# Patient Record
Sex: Male | Born: 1995 | Race: White | Hispanic: No | Marital: Married | Smoking: Current some day smoker
Health system: Southern US, Community
[De-identification: ages and names within clinical notes are randomized; demographics above are authoritative.]

## PROBLEM LIST (undated history)

## (undated) DIAGNOSIS — B9561 Methicillin susceptible Staphylococcus aureus infection as the cause of diseases classified elsewhere: Secondary | ICD-10-CM

## (undated) DIAGNOSIS — F1990 Other psychoactive substance use, unspecified, uncomplicated: Secondary | ICD-10-CM

## (undated) DIAGNOSIS — R7881 Bacteremia: Secondary | ICD-10-CM

## (undated) DIAGNOSIS — F1910 Other psychoactive substance abuse, uncomplicated: Secondary | ICD-10-CM

## (undated) HISTORY — PX: TONSILLECTOMY: SUR1361

---
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
## 2013-01-26 ENCOUNTER — Emergency Department: Payer: Self-pay | Admitting: Emergency Medicine

## 2014-03-26 ENCOUNTER — Emergency Department: Payer: Self-pay | Admitting: Emergency Medicine

## 2014-04-19 ENCOUNTER — Emergency Department: Payer: Self-pay | Admitting: General Practice

## 2014-08-21 ENCOUNTER — Emergency Department (HOSPITAL_COMMUNITY)
Admission: EM | Admit: 2014-08-21 | Discharge: 2014-08-21 | Disposition: A | Discharge status: Home / Self Care | Payer: Self-pay | Attending: Emergency Medicine | Admitting: Emergency Medicine

## 2014-08-21 ENCOUNTER — Encounter (HOSPITAL_COMMUNITY): Payer: Self-pay | Admitting: Emergency Medicine

## 2014-08-21 DIAGNOSIS — Z720 Tobacco use: Secondary | ICD-10-CM | POA: Insufficient documentation

## 2014-08-21 DIAGNOSIS — T50901A Poisoning by unspecified drugs, medicaments and biological substances, accidental (unintentional), initial encounter: Secondary | ICD-10-CM

## 2014-08-21 DIAGNOSIS — T424X1A Poisoning by benzodiazepines, accidental (unintentional), initial encounter: Secondary | ICD-10-CM | POA: Insufficient documentation

## 2014-08-21 DIAGNOSIS — Y998 Other external cause status: Secondary | ICD-10-CM | POA: Insufficient documentation

## 2014-08-21 DIAGNOSIS — Y9289 Other specified places as the place of occurrence of the external cause: Secondary | ICD-10-CM | POA: Insufficient documentation

## 2014-08-21 DIAGNOSIS — T401X1A Poisoning by heroin, accidental (unintentional), initial encounter: Secondary | ICD-10-CM | POA: Insufficient documentation

## 2014-08-21 DIAGNOSIS — Y9389 Activity, other specified: Secondary | ICD-10-CM | POA: Insufficient documentation

## 2014-08-21 DIAGNOSIS — X58XXXA Exposure to other specified factors, initial encounter: Secondary | ICD-10-CM | POA: Insufficient documentation

## 2014-08-21 LAB — CBC WITH DIFFERENTIAL/PLATELET
BASOS ABS: 0 10*3/uL (ref 0.0–0.1)
Basophils Relative: 0 % (ref 0–1)
Eosinophils Absolute: 0.1 10*3/uL (ref 0.0–0.7)
Eosinophils Relative: 1 % (ref 0–5)
HCT: 43.1 % (ref 39.0–52.0)
HEMOGLOBIN: 14.4 g/dL (ref 13.0–17.0)
LYMPHS PCT: 7 % — AB (ref 12–46)
Lymphs Abs: 1.3 10*3/uL (ref 0.7–4.0)
MCH: 29.3 pg (ref 26.0–34.0)
MCHC: 33.4 g/dL (ref 30.0–36.0)
MCV: 87.6 fL (ref 78.0–100.0)
Monocytes Absolute: 0.8 10*3/uL (ref 0.1–1.0)
Monocytes Relative: 5 % (ref 3–12)
NEUTROS PCT: 87 % — AB (ref 43–77)
Neutro Abs: 15 10*3/uL — ABNORMAL HIGH (ref 1.7–7.7)
PLATELETS: 367 10*3/uL (ref 150–400)
RBC: 4.92 MIL/uL (ref 4.22–5.81)
RDW: 14.6 % (ref 11.5–15.5)
WBC: 17.2 10*3/uL — ABNORMAL HIGH (ref 4.0–10.5)

## 2014-08-21 LAB — BASIC METABOLIC PANEL
ANION GAP: 6 (ref 5–15)
BUN: 8 mg/dL (ref 6–20)
CO2: 29 mmol/L (ref 22–32)
Calcium: 9.2 mg/dL (ref 8.9–10.3)
Chloride: 103 mmol/L (ref 101–111)
Creatinine, Ser: 1.04 mg/dL (ref 0.61–1.24)
GFR calc non Af Amer: >60 mL/min (ref >60)
Glucose, Bld: 139 mg/dL — ABNORMAL HIGH (ref 65–99)
POTASSIUM: 3.9 mmol/L (ref 3.5–5.1)
Sodium: 138 mmol/L (ref 135–145)

## 2014-08-21 MED ORDER — SODIUM CHLORIDE 0.9 % IV SOLN
INTRAVENOUS | Status: DC
  Administered 2014-08-21: 17:00:00 via INTRAVENOUS

## 2014-08-21 NOTE — Discharge Instructions (Signed)
Accidental Overdose  A drug overdose occurs when a chemical substance (drug or medication) is used in amounts large enough to overcome a person. This may result in severe illness or death. This is a type of poisoning. Accidental overdoses of medications or other substances come from a variety of reasons. When this happens accidentally, it is often because the person taking the substance does not know enough about what they have taken. Drugs which commonly cause overdose deaths are alcohol, psychotropic medications (medications which affect the mind), pain medications, illegal drugs (street drugs) such as cocaine and heroin, and multiple drugs taken at the same time. It may result from careless behavior (such as over-indulging at a party). Other causes of overdose may include multiple drug use, a lapse in memory, or drug use after a period of no drug use.   Sometimes overdosing occurs because a person cannot remember if they have taken their medication.   A common unintentional overdose in young children involves multi-vitamins containing iron. Iron is a part of the hemoglobin molecule in blood. It is used to transport oxygen to living cells. When taken in small amounts, iron allows the body to restock hemoglobin. In large amounts, it causes problems in the body. If this overdose is not treated, it can lead to death.  Never take medicines that show signs of tampering or do not seem quite right. Never take medicines in the dark or in poor lighting. Read the label and check each dose of medicine before you take it. When adults are poisoned, it happens most often through carelessness or lack of information. Taking medicines in the dark or taking medicine prescribed for someone else to treat the same type of problem is a dangerous practice.  SYMPTOMS   Symptoms of overdose depend on the medication and amount taken. They can vary from over-activity with stimulant over-dosage, to sleepiness from depressants such as  alcohol, narcotics and tranquilizers. Confusion, dizziness, nausea and vomiting may be present. If problems are severe enough coma and death may result.  DIAGNOSIS   Diagnosis and management are generally straightforward if the drug is known. Otherwise it is more difficult. At times, certain symptoms and signs exhibited by the patient, or blood tests, can reveal the drug in question.   TREATMENT   In an emergency department, most patients can be treated with supportive measures. Antidotes may be available if there has been an overdose of opioids or benzodiazepines. A rapid improvement will often occur if this is the cause of overdose.  At home or away from medical care:   There may be no immediate problems or warning signs in children.   Not everything works well in all cases of poisoning.   Take immediate action. Poisons may act quickly.   If you think someone has swallowed medicine or a household product, and the person is unconscious, having seizures (convulsions), or is not breathing, immediately call for an ambulance.  IF a person is conscious and appears to be doing OK but has swallowed a poison:   Do not wait to see what effect the poison will have. Immediately call a poison control center (listed in the white pages of your telephone book under "Poison Control" or inside the front cover with other emergency numbers). Some poison control centers have TTY capability for the deaf. Check with your local center if you or someone in your family requires this service.   Keep the container so you can read the label on the product for ingredients.     Describe what, when, and how much was taken and the age and condition of the person poisoned. Inform them if the person is vomiting, choking, drowsy, shows a change in color or temperature of skin, is conscious or unconscious, or is convulsing.   Do not cause vomiting unless instructed by medical personnel. Do not induce vomiting or force liquids into a person who  is convulsing, unconscious, or very drowsy.  Stay calm and in control.    Activated charcoal also is sometimes used in certain types of poisoning and you may wish to add a supply to your emergency medicines. It is available without a prescription. Call a poison control center before using this medication.  PREVENTION   Thousands of children die every year from unintentional poisoning. This may be from household chemicals, poisoning from carbon monoxide in a car, taking their parent's medications, or simply taking a few iron pills or vitamins with iron. Poisoning comes from unexpected sources.   Store medicines out of the sight and reach of children, preferably in a locked cabinet. Do not keep medications in a food cabinet. Always store your medicines in a secure place. Get rid of expired medications.   If you have children living with you or have them as occasional guests, you should have child-resistant caps on your medicine containers. Keep everything out of reach. Child proof your home.   If you are called to the telephone or to answer the door while you are taking a medicine, take the container with you or put the medicine out of the reach of small children.   Do not take your medication in front of children. Do not tell your child how good a medication is and how good it is for them. They may get the idea it is more of a treat.   If you are an adult and have accidentally taken an overdose, you need to consider how this happened and what can be done to prevent it from happening again. If this was from a street drug or alcohol, determine if there is a problem that needs addressing. If you are not sure a problems exists, it is easy to talk to a professional and ask them if they think you have a problem. It is better to handle this problem in this way before it happens again and has a much worse consequence.  Document Released: 05/21/2004 Document Revised: 05/30/2011 Document Reviewed: 10/27/2008  [employer]  Patient Information 2015 [employer]. This information is not intended to replace advice given to you by your health care provider. Make sure you discuss any questions you have with your health care provider.

## 2014-08-21 NOTE — ED Notes (Signed)
Per EMS - PT found on sidewalk by anonymous caller who notified Fire Department.  Upon FD arrival, pt was reported to be in respiratory arrest, pt had fresh track marks, was "blue" in color and unresponsive.  PT was bagged, given 2mg  Narcan intranasally, which had no change.  Pt then given another 2mg  Narcan IV via 18g in RAC, and "perked back up."  Sinus tach at 110, BP 128/64, 18 RR. Lungs clear. Pt denies pain. Pt aware of what happened, states he wasn't trying to kill himself.

## 2014-08-21 NOTE — ED Provider Notes (Signed)
CSN: [id number]     Arrival date & time 08/21/14  1547 History   First MD Initiated Contact with Patient 08/21/14 1601     Chief Complaint  Patient presents with  . Drug Overdose   HPI Patient presents to the emergency room after an accidental drug overdose. 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 He also has been using Klonopin's. Patient took the 2 of them together today. He thinks he must have accidentally taken too much. He was found on the side of a sidewalk unresponsive by bystanders. Patient underwent CPR.  He was given 2 mg of Narcan IV. Patient denies any suicidal ideation or intention. He knows he has a drug problem and is planning on going into a rehabilitation facility in [place]. History reviewed. No pertinent past medical history. Past Surgical History  Procedure Laterality Date  . Tonsillectomy     History reviewed. No pertinent family history. History  Substance Use Topics  . Smoking status: Current Every Day Smoker -- 0.50 packs/day for 5 years    Types: Cigarettes  . Smokeless tobacco: Not on file  . Alcohol Use: Yes     Comment: occasionally    Review of Systems  All other systems reviewed and are negative.     Allergies  Sulfa antibiotics  Home Medications   Prior to Admission medications   Not on File   BP 104/62 mmHg  Pulse 66  Temp(Src) 97.3 F (36.3 C) (Oral)  Resp 20  Ht 6\' 1"  (1.854 m)  Wt 160 lb (72.576 kg)  BMI 21.11 kg/m2  SpO2 99% Physical Exam  Constitutional: He appears well-developed and well-nourished. No distress.  HENT:  Head: Normocephalic and atraumatic.  Right Ear: External ear normal.  Left Ear: External ear normal.  Eyes: Conjunctivae are normal. Right eye exhibits no discharge. Left eye exhibits no discharge. No scleral icterus.  Neck: Neck supple. No tracheal deviation present.  Cardiovascular: Normal rate, regular rhythm and intact distal pulses.   Pulmonary/Chest: Effort normal and breath sounds normal. No  stridor. No respiratory distress. He has no wheezes. He has no rales.  Abdominal: Soft. Bowel sounds are normal. He exhibits no distension. There is no tenderness. There is no rebound and no guarding.  Musculoskeletal: He exhibits no edema or tenderness.  Neurological: He is alert. He has normal strength. No cranial nerve deficit (no facial droop, extraocular movements intact, no slurred speech) or sensory deficit. He exhibits normal muscle tone. He displays no seizure activity. Coordination normal.  Skin: Skin is warm and dry. No rash noted.  Psychiatric: He has a normal mood and affect.  Nursing note and vitals reviewed.   ED Course  Procedures (including critical care time) Labs Review Labs Reviewed  CBC WITH DIFFERENTIAL/PLATELET - Abnormal; Notable for the following:    WBC 17.2 (*)    Neutrophils Relative % 87 (*)    Neutro Abs 15.0 (*)    Lymphocytes Relative 7 (*)    All other components within normal limits  BASIC METABOLIC PANEL - Abnormal; Notable for the following:    Glucose, Bld 139 (*)    All other components within normal limits    Imaging Review No results found.   EKG Interpretation   Date/Time:  Thursday August 21 2014 15:47:47 EDT Ventricular Rate:  100 PR Interval:  137 QRS Duration: 102 QT Interval:  358 QTC Calculation: 462 R Axis:   80 Text Interpretation:  Sinus tachycardia Possible WPW , ventricular  pre-excitation syndrome No  old tracing to compare Confirmed by [facility]   MD-J, Sonam Wandel [id number]) on 08/21/2014 4:03:33 PM      MDM   Final diagnoses:  Accidental drug overdose, initial encounter    Patient was monitored in the emergency department. He remained awake and alert.  Patient feels ready to go home. Family member is with him and will be able to watch him at home.  I counseled the patient to stop using heroin and other drugs.  At this time there does not appear to be any evidence of an acute emergency medical condition and the patient appears  stable for discharge with appropriate outpatient follow up.    Linwood Dibbles, MD 08/21/14 [phone]

## 2015-05-10 ENCOUNTER — Encounter: Payer: Self-pay | Admitting: Emergency Medicine

## 2015-05-10 DIAGNOSIS — L02413 Cutaneous abscess of right upper limb: Secondary | ICD-10-CM | POA: Diagnosis not present

## 2015-05-10 DIAGNOSIS — L03113 Cellulitis of right upper limb: Secondary | ICD-10-CM | POA: Insufficient documentation

## 2015-05-10 DIAGNOSIS — F17210 Nicotine dependence, cigarettes, uncomplicated: Secondary | ICD-10-CM | POA: Insufficient documentation

## 2015-05-10 NOTE — ED Notes (Addendum)
Pt with abscess to right AC after using IV drugs; denies fever; denies drainage; no redness surrounding area; tender to touch

## 2015-05-11 ENCOUNTER — Emergency Department
Admission: EM | Admit: 2015-05-11 | Discharge: 2015-05-11 | Disposition: A | Discharge status: Home / Self Care | Payer: Federal, State, Local not specified - PPO | Attending: Emergency Medicine | Admitting: Emergency Medicine

## 2015-05-11 DIAGNOSIS — L0291 Cutaneous abscess, unspecified: Secondary | ICD-10-CM

## 2015-05-11 DIAGNOSIS — L03113 Cellulitis of right upper limb: Secondary | ICD-10-CM

## 2015-05-11 LAB — CBC
HEMATOCRIT: 43 % (ref 40.0–52.0)
Hemoglobin: 14.7 g/dL (ref 13.0–18.0)
MCH: 30.1 pg (ref 26.0–34.0)
MCHC: 34.3 g/dL (ref 32.0–36.0)
MCV: 87.8 fL (ref 80.0–100.0)
PLATELETS: 345 10*3/uL (ref 150–440)
RBC: 4.89 MIL/uL (ref 4.40–5.90)
RDW: 13.7 % (ref 11.5–14.5)
WBC: 10.5 10*3/uL (ref 3.8–10.6)

## 2015-05-11 LAB — BASIC METABOLIC PANEL
Anion gap: 6 (ref 5–15)
BUN: 11 mg/dL (ref 6–20)
CO2: 27 mmol/L (ref 22–32)
Calcium: 9.1 mg/dL (ref 8.9–10.3)
Chloride: 107 mmol/L (ref 101–111)
Creatinine, Ser: 0.75 mg/dL (ref 0.61–1.24)
GFR calc Af Amer: >60 mL/min (ref >60)
GLUCOSE: 137 mg/dL — AB (ref 65–99)
Potassium: 3.5 mmol/L (ref 3.5–5.1)
Sodium: 140 mmol/L (ref 135–145)

## 2015-05-11 MED ORDER — MORPHINE SULFATE (PF) 4 MG/ML IV SOLN
4.0000 mg | Freq: Once | INTRAVENOUS | Status: AC
  Administered 2015-05-11: 4 mg via INTRAVENOUS

## 2015-05-11 MED ORDER — TRAMADOL HCL 50 MG PO TABS
50.0000 mg | ORAL_TABLET | Freq: Once | ORAL | Status: AC
  Administered 2015-05-11: 50 mg via ORAL
  Filled 2015-05-11: qty 1

## 2015-05-11 MED ORDER — TRAMADOL HCL 50 MG PO TABS: 50.0000 mg | ORAL_TABLET | Freq: Four times a day (QID) | ORAL | Status: DC | PRN

## 2015-05-11 MED ORDER — CLINDAMYCIN HCL 300 MG PO CAPS: 300.0000 mg | ORAL_CAPSULE | Freq: Three times a day (TID) | ORAL | Status: DC

## 2015-05-11 MED ORDER — MORPHINE SULFATE (PF) 4 MG/ML IV SOLN
INTRAVENOUS | Status: AC
  Filled 2015-05-11: qty 1

## 2015-05-11 MED ORDER — MORPHINE SULFATE (PF) 4 MG/ML IV SOLN
4.0000 mg | Freq: Once | INTRAVENOUS | Status: AC
  Administered 2015-05-11: 4 mg via INTRAVENOUS
  Filled 2015-05-11: qty 1

## 2015-05-11 MED ORDER — LIDOCAINE HCL (PF) 1 % IJ SOLN
5.0000 mL | Freq: Once | INTRAMUSCULAR | Status: AC
  Filled 2015-05-11: qty 5

## 2015-05-11 MED ORDER — CLINDAMYCIN PHOSPHATE 600 MG/50ML IV SOLN
600.0000 mg | Freq: Once | INTRAVENOUS | Status: AC
  Administered 2015-05-11: 600 mg via INTRAVENOUS
  Filled 2015-05-11: qty 50

## 2015-05-11 MED ORDER — ONDANSETRON HCL 4 MG/2ML IJ SOLN
4.0000 mg | Freq: Once | INTRAMUSCULAR | Status: AC
  Administered 2015-05-11: 4 mg via INTRAVENOUS
  Filled 2015-05-11: qty 2

## 2015-05-11 NOTE — ED Notes (Signed)
Pt requesting additional pain medication. Dr. Zenda Alpers notified.

## 2015-05-11 NOTE — Discharge Instructions (Signed)
Abscess °An abscess is an infected area that contains a collection of pus and debris. It can occur in almost any part of the body. An abscess is also known as a furuncle or boil. °CAUSES  °An abscess occurs when tissue gets infected. This can occur from blockage of oil or sweat glands, infection of hair follicles, or a minor injury to the skin. As the body tries to fight the infection, pus collects in the area and creates pressure under the skin. This pressure causes pain. People with weakened immune systems have difficulty fighting infections and get certain abscesses more often.  °SYMPTOMS °Usually an abscess develops on the skin and becomes a painful mass that is red, warm, and tender. If the abscess forms under the skin, you may feel a moveable soft area under the skin. Some abscesses break open (rupture) on their own, but most will continue to get worse without care. The infection can spread deeper into the body and eventually into the bloodstream, causing you to feel ill.  °DIAGNOSIS  °Your caregiver will take your medical history and perform a physical exam. A sample of fluid may also be taken from the abscess to determine what is causing your infection. °TREATMENT  °Your caregiver may prescribe antibiotic medicines to fight the infection. However, taking antibiotics alone usually does not cure an abscess. Your caregiver may need to make a small cut (incision) in the abscess to drain the pus. In some cases, gauze is packed into the abscess to reduce pain and to continue draining the area. °HOME CARE INSTRUCTIONS  °· Only take over-the-counter or prescription medicines for pain, discomfort, or fever as directed by your caregiver. °· If you were prescribed antibiotics, take them as directed. Finish them even if you start to feel better. °· If gauze is used, follow your caregiver's directions for changing the gauze. °· To avoid spreading the infection: °· Keep your draining abscess covered with a  bandage. °· Wash your hands well. °· Do not share personal care items, towels, or whirlpools with others. °· Avoid skin contact with others. °· Keep your skin and clothes clean around the abscess. °· Keep all follow-up appointments as directed by your caregiver. °SEEK MEDICAL CARE IF:  °· You have increased pain, swelling, redness, fluid drainage, or bleeding. °· You have muscle aches, chills, or a general ill feeling. °· You have a fever. °MAKE SURE YOU:  °· Understand these instructions. °· Will watch your condition. °· Will get help right away if you are not doing well or get worse. °  °This information is not intended to replace advice given to you by your health care provider. Make sure you discuss any questions you have with your health care provider. °  °Document Released: 12/15/2004 Document Revised: 09/06/2011 Document Reviewed: 05/20/2011 °Elsevier Interactive Patient Education ©2016 [employer] ° °Cellulitis °Cellulitis is an infection of the skin and the tissue beneath it. The infected area is usually red and tender. Cellulitis occurs most often in the arms and lower legs.  °CAUSES  °Cellulitis is caused by bacteria that enter the skin through cracks or cuts in the skin. The most common types of bacteria that cause cellulitis are staphylococci and streptococci. °SIGNS AND SYMPTOMS  °· Redness and warmth. °· Swelling. °· Tenderness or pain. °· Fever. °DIAGNOSIS  °Your health care provider can usually determine what is wrong based on a physical exam. Blood tests may also be done. °TREATMENT  °Treatment usually involves taking an antibiotic medicine. °HOME CARE INSTRUCTIONS  °·   Take your antibiotic medicine as directed by your health care provider. Finish the antibiotic even if you start to feel better. °· Keep the infected arm or leg elevated to reduce swelling. °· Apply a warm cloth to the affected area up to 4 times per day to relieve pain. °· Take medicines only as directed by your health care  provider. °· Keep all follow-up visits as directed by your health care provider. °SEEK MEDICAL CARE IF:  °· You notice red streaks coming from the infected area. °· Your red area gets larger or turns dark in color. °· Your bone or joint underneath the infected area becomes painful after the skin has healed. °· Your infection returns in the same area or another area. °· You notice a swollen bump in the infected area. °· You develop new symptoms. °· You have a fever. °SEEK IMMEDIATE MEDICAL CARE IF:  °· You feel very sleepy. °· You develop vomiting or diarrhea. °· You have a general ill feeling (malaise) with muscle aches and pains. °  °This information is not intended to replace advice given to you by your health care provider. Make sure you discuss any questions you have with your health care provider. °  °Document Released: 12/15/2004 Document Revised: 11/26/2014 Document Reviewed: 05/23/2011 °Elsevier Interactive Patient Education ©2016 [employer] ° °Incision and Drainage °Incision and drainage is a procedure in which a sac-like structure (cystic structure) is opened and drained. The area to be drained usually contains material such as pus, fluid, or blood.  °LET YOUR CAREGIVER KNOW ABOUT:  °· Allergies to medicine. °· Medicines taken, including vitamins, herbs, eyedrops, over-the-counter medicines, and creams. °· Use of steroids (by mouth or creams). °· Previous problems with anesthetics or numbing medicines. °· History of bleeding problems or blood clots. °· Previous surgery. °· Other health problems, including diabetes and kidney problems. °· Possibility of pregnancy, if this applies. °RISKS AND COMPLICATIONS °· Pain. °· Bleeding. °· Scarring. °· Infection. °BEFORE THE PROCEDURE  °You may need to have an ultrasound or other imaging tests to see how large or deep your cystic structure is. Blood tests may also be used to determine if you have an infection or how severe the infection is. You may need to have a  tetanus shot. °PROCEDURE  °The affected area is cleaned with a cleaning fluid. The cyst area will then be numbed with a medicine (local anesthetic). A small incision will be made in the cystic structure. A syringe or catheter may be used to drain the contents of the cystic structure, or the contents may be squeezed out. The area will then be flushed with a cleansing solution. After cleansing the area, it is often gently packed with a gauze or another wound dressing. Once it is packed, it will be covered with gauze and tape or some other type of wound dressing.  °AFTER THE PROCEDURE  °· Often, you will be allowed to go home right after the procedure. °· You may be given antibiotic medicine to prevent or heal an infection. °· If the area was packed with gauze or some other wound dressing, you will likely need to come back in 1 to 2 days to get it removed. °· The area should heal in about 14 days. °  °This information is not intended to replace advice given to you by your health care provider. Make sure you discuss any questions you have with your health care provider. °  °Document Released: 08/31/2000 Document Revised: 09/06/2011 Document Reviewed: 05/02/2011 °  Elsevier Interactive Patient Education ©2016 [employer] Inc[employer] ° °

## 2015-05-11 NOTE — ED Provider Notes (Signed)
[facility] Department Provider Note  ____________________________________________  Time seen: Approximately 351 AM  I have reviewed the triage vital signs and the nursing notes.   HISTORY  Chief Complaint Abscess    HPI Elijah Hall is a 20 y.o. male who comes into the hospital with abscess to his right antecubital fossa. The patient reports that he noticed that area with swelling with some redness 2 days ago. He reports is gotten bigger and is more tender since then. He denies any fevers nausea or vomiting. He took some I Profen for pain at home but he reports has not been helping. He has never had this before. 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 He reports that he shared a needle and injected in that same area.The patient reports that this pain is 8 out of 10 in intensity. The area is very tender to touch so he decided to come in and get checked out.   History reviewed. No pertinent past medical history.  There are no active problems to display for this patient.   Past Surgical History  Procedure Laterality Date  . Tonsillectomy      Current Outpatient Rx  Name  Route  Sig  Dispense  Refill  . clindamycin (CLEOCIN) 300 MG capsule   Oral   Take 1 capsule (300 mg total) by mouth 3 (three) times daily.   30 capsule   0   . traMADol (ULTRAM) 50 MG tablet   Oral   Take 1 tablet (50 mg total) by mouth every 6 (six) hours as needed.   12 tablet   0     Allergies Sulfa antibiotics  History reviewed. No pertinent family history.  Social History Social History  Substance Use Topics  . Smoking status: Current Some Day Smoker -- 0.00 packs/day for 5 years    Types: Cigarettes  . Smokeless tobacco: None  . Alcohol Use: Yes     Comment: rarely    Review of Systems Constitutional: No fever/chills Eyes: No visual changes. ENT: No sore throat. Cardiovascular: Denies chest  pain. Respiratory: Denies shortness of breath. Gastrointestinal: No abdominal pain.  No nausea, no vomiting.  No diarrhea.  No constipation. Genitourinary: Negative for dysuria. Musculoskeletal: Negative for back pain. Skin: Abscess to right antecubital fossa Neurological: Negative for headaches, focal weakness or numbness.  10-point ROS otherwise negative.  ____________________________________________   PHYSICAL EXAM:  VITAL SIGNS: ED Triage Vitals  Enc Vitals Group     BP 05/10/15 2343 124/66 mmHg     Pulse Rate 05/10/15 2343 87     Resp 05/10/15 2343 18     Temp 05/10/15 2343 97.6 F (36.4 C)     Temp Source 05/10/15 2343 Oral     SpO2 05/10/15 2343 98 %     Weight 05/10/15 2343 165 lb (74.844 kg)     Height 05/10/15 2343  (1.854 m)     Head Cir --      Peak Flow --      Pain Score 05/10/15 2343 8     Pain Loc --      Pain Edu? --      Excl. in GC? --     Constitutional: Alert and oriented. Well appearing and in moderate distress. Eyes: Conjunctivae are normal. PERRL. EOMI. Head: Atraumatic. Nose: No congestion/rhinnorhea. Mouth/Throat: Mucous membranes are moist.  Oropharynx non-erythematous. Cardiovascular: Normal rate, regular rhythm. Grossly normal heart sounds.  Good peripheral circulation. Respiratory: Normal respiratory effort.  No retractions. Lungs CTAB. Gastrointestinal: Soft and nontender. No distention. Positive bowel sounds Musculoskeletal: No lower extremity tenderness nor edema.   Neurologic:  Normal speech and language.  Skin:  Abscess to right medial antecubital fossa with some erythema to the top tender palpation nonfluctuant Psychiatric: Mood and affect are normal.   ____________________________________________   LABS (all labs ordered are listed, but only abnormal results are displayed)  Labs Reviewed  BASIC METABOLIC PANEL - Abnormal; Notable for the following:    Glucose, Bld 137 (*)    All other components within normal limits   CBC   ____________________________________________  EKG  None ____________________________________________  RADIOLOGY  None ____________________________________________   PROCEDURES  Procedure(s) performed: please, see procedure note(s).   INCISION AND DRAINAGE Performed by: Lucrezia Europe P Consent: Verbal consent obtained. Risks and benefits: risks, benefits and alternatives were discussed Type: abscess  Body area: right antecubital fossa  Anesthesia: local infiltration  Incision was made with a scalpel.  Local anesthetic: lidocaine 1 % without epinephrine  Anesthetic total: 1 ml  Complexity: complex Blunt dissection to break up loculations  Drainage: purulent  Drainage amount: Moderate   Packing material: Unable to pack   Patient tolerance: Patient tolerated the procedure well with no immediate complications.     Critical Care performed: No  ____________________________________________   INITIAL IMPRESSION / ASSESSMENT AND PLAN / ED COURSE  Pertinent labs & imaging results that were available during my care of the patient were reviewed by me and considered in my medical decision making (see chart for details).  This is a 20 year old male who comes into the hospital today with an abscess to his right antecubital fossa. The initial concern was could this be a pseudoaneurysm. I did place an ultrasound on the area and it did show some complex material in the process. I also attempted to aspirate to determine if blood would be aspirated or purulent material and it was purulent material. I then incised the area with 1 1/2-2 cm incision drain some material. I attempted to pack but the area was not deep enough to be packed. The patient did receive some clindamycin as well as some morphine. I will give him some tramadol for pain for home and give him clindamycin. The patient should return in 2 days to have the wound rechecked or if he has any worsening  symptoms vomiting fevers or any other complications ____________________________________________   FINAL CLINICAL IMPRESSION(S) / ED DIAGNOSES  Final diagnoses:  Abscess  Cellulitis of right upper extremity      Rebecka Apley, MD 05/11/15 [phone]

## 2015-05-11 NOTE — ED Notes (Signed)
Dr. Zenda Alpers in to i and d abscess.

## 2015-05-11 NOTE — ED Notes (Signed)
Pt updated on treatment plan

## 2015-05-14 ENCOUNTER — Emergency Department
Admission: EM | Admit: 2015-05-14 | Discharge: 2015-05-14 | Disposition: A | Discharge status: Home / Self Care | Payer: Federal, State, Local not specified - PPO | Attending: Emergency Medicine | Admitting: Emergency Medicine

## 2015-05-14 DIAGNOSIS — Z4801 Encounter for change or removal of surgical wound dressing: Secondary | ICD-10-CM | POA: Diagnosis not present

## 2015-05-14 DIAGNOSIS — Z5189 Encounter for other specified aftercare: Secondary | ICD-10-CM

## 2015-05-14 DIAGNOSIS — F17210 Nicotine dependence, cigarettes, uncomplicated: Secondary | ICD-10-CM | POA: Diagnosis not present

## 2015-05-14 MED ORDER — MUPIROCIN 2 % EX OINT: TOPICAL_OINTMENT | CUTANEOUS | Status: AC

## 2015-05-14 NOTE — ED Provider Notes (Signed)
[facility] Emergency Department Provider Note     Time seen: ----------------------------------------- 10:04 PM on 05/14/2015 -----------------------------------------    I have reviewed the triage vital signs and the nursing notes.   HISTORY  Chief Complaint Wound Check    HPI Dishon Kehoe is a 20 y.o. male who presents ER for wound recheck. Patient had an I&D of an abscess on Monday morning. Patient presents to have the packing removed, he feels like it's healing well his been keeping it clean. He denies any other complaints.   History reviewed. No pertinent past medical history.  There are no active problems to display for this patient.   Past Surgical History  Procedure Laterality Date  . Tonsillectomy      Allergies Sulfa antibiotics  Social History Social History  Substance Use Topics  . Smoking status: Current Some Day Smoker -- 0.00 packs/day for 5 years    Types: Cigarettes  . Smokeless tobacco: None  . Alcohol Use: Yes     Comment: rarely    Review of Systems Constitutional: Negative for fever. Skin: Positive for wound in the right antecubital fossa  ____________________________________________   PHYSICAL EXAM:  VITAL SIGNS: ED Triage Vitals  Enc Vitals Group     BP 05/14/15 2126 115/70 mmHg     Pulse Rate 05/14/15 2126 78     Resp 05/14/15 2126 17     Temp 05/14/15 2126 98.3 F (36.8 C)     Temp Source 05/14/15 2126 Oral     SpO2 05/14/15 2126 99 %     Weight 05/14/15 2126 165 lb (74.844 kg)     Height 05/14/15 2126  (1.854 m)     Head Cir --      Peak Flow --      Pain Score 05/14/15 2127 0     Pain Loc --      Pain Edu? --      Excl. in GC? --     Constitutional: Alert and oriented. Well appearing and in no distress. Musculoskeletal: Small 1.5 cm healing abscess noted to the right before meals. No packing is noted in the wound. No signs of surrounding cellulitis. Skin:  Healing abscess as noted  above ____________________________________________  ED COURSE:  Pertinent labs & imaging results that were available during my care of the patient were reviewed by me and considered in my medical decision making (see chart for details). We'll recheck, wound looks good at this time, does not need further treatment other than topical antibiotic ointment  ____________________________________________  FINAL ASSESSMENT AND PLAN  Wound check  Plan: Patient with abscess status post incision and drainage. 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 Patient states is gone in the morning for drug treatment. I will prescribe Bactroban ointment.   Emily Filbert, MD   Emily Filbert, MD 05/14/15 2206

## 2015-05-14 NOTE — ED Notes (Signed)
Pt. Going home by self will follow up with PCP if needed.

## 2015-05-14 NOTE — ED Notes (Signed)
Patient had I&D of abscess on Monday morning. Here today to have packing removed. Patient states the area has been healing well and he has been keeping the area clean. Area is well healed without signs of infection and no drainage.

## 2015-05-14 NOTE — Discharge Instructions (Signed)
Wound Care °Taking care of your wound properly can help to prevent pain and infection. It can also help your wound to heal more quickly.  °HOW TO CARE FOR YOUR WOUND  °· Take or apply over-the-counter and prescription medicines only as told by your health care provider. °· If you were prescribed antibiotic medicine, take or apply it as told by your health care provider. Do not stop using the antibiotic even if your condition improves. °· Clean the wound each day or as told by your health care provider. °¨ Wash the wound with mild soap and water. °¨ Rinse the wound with water to remove all soap. °¨ Pat the wound dry with a clean towel. Do not rub it. °· There are many different ways to close and cover a wound. For example, a wound can be covered with stitches (sutures), skin glue, or adhesive strips. Follow instructions from your health care provider about: °¨ How to take care of your wound. °¨ When and how you should change your bandage (dressing). °¨ When you should remove your dressing. °¨ Removing whatever was used to close your wound. °· Check your wound every day for signs of infection. Watch for: °¨ Redness, swelling, or pain. °¨ Fluid, blood, or pus. °· Keep the dressing dry until your health care provider says it can be removed. Do not take baths, swim, use a hot tub, or do anything that would put your wound underwater until your health care provider approves. °· Raise (elevate) the injured area above the level of your heart while you are sitting or lying down. °· Do not scratch or pick at the wound. °· Keep all follow-up visits as told by your health care provider. This is important. °SEEK MEDICAL CARE IF: °· You received a tetanus shot and you have swelling, severe pain, redness, or bleeding at the injection site. °· You have a fever. °· Your pain is not controlled with medicine. °· You have increased redness, swelling, or pain at the site of your wound. °· You have fluid, blood, or pus coming from your  wound. °· You notice a bad smell coming from your wound or your dressing. °SEEK IMMEDIATE MEDICAL CARE IF: °· You have a red streak going away from your wound. °  °This information is not intended to replace advice given to you by your health care provider. Make sure you discuss any questions you have with your health care provider. °  °Document Released: 12/15/2007 Document Revised: 07/22/2014 Document Reviewed: 03/03/2014 °Elsevier Interactive Patient Education ©2016 [employer] ° °

## 2016-11-20 ENCOUNTER — Emergency Department (HOSPITAL_COMMUNITY)
Admission: EM | Admit: 2016-11-20 | Discharge: 2016-11-20 | Disposition: A | Discharge status: Home / Self Care | Payer: Federal, State, Local not specified - PPO | Attending: Emergency Medicine | Admitting: Emergency Medicine

## 2016-11-20 ENCOUNTER — Encounter (HOSPITAL_COMMUNITY): Payer: Self-pay

## 2016-11-20 DIAGNOSIS — R4182 Altered mental status, unspecified: Secondary | ICD-10-CM | POA: Diagnosis not present

## 2016-11-20 DIAGNOSIS — F1110 Opioid abuse, uncomplicated: Secondary | ICD-10-CM | POA: Diagnosis not present

## 2016-11-20 DIAGNOSIS — Z5321 Procedure and treatment not carried out due to patient leaving prior to being seen by health care provider: Secondary | ICD-10-CM | POA: Insufficient documentation

## 2016-11-20 DIAGNOSIS — T401X1A Poisoning by heroin, accidental (unintentional), initial encounter: Secondary | ICD-10-CM

## 2016-11-20 NOTE — ED Notes (Signed)
Pt IV removed

## 2016-11-20 NOTE — ED Triage Notes (Signed)
Pt BIB friend after heroin use. Pt unresponsive upon arrival. He appeared pale with blue lips upon arrival. Pt given narcan and is now responsive.

## 2016-11-20 NOTE — ED Notes (Signed)
Pt leaving AMA. He verbalized understanding of the risks and what could happen when the narcan wears off. He states "I'm willing to take the chance." He states that he has narcan available for his use if needed.

## 2017-02-17 ENCOUNTER — Other Ambulatory Visit: Payer: Self-pay

## 2017-02-17 ENCOUNTER — Emergency Department (HOSPITAL_COMMUNITY)
Admission: EM | Admit: 2017-02-17 | Discharge: 2017-02-17 | Disposition: A | Discharge status: Home / Self Care | Payer: Federal, State, Local not specified - PPO | Attending: Emergency Medicine | Admitting: Emergency Medicine

## 2017-02-17 ENCOUNTER — Encounter (HOSPITAL_COMMUNITY): Payer: Self-pay | Admitting: Emergency Medicine

## 2017-02-17 ENCOUNTER — Emergency Department (HOSPITAL_COMMUNITY): Payer: Federal, State, Local not specified - PPO

## 2017-02-17 DIAGNOSIS — W458XXA Other foreign body or object entering through skin, initial encounter: Secondary | ICD-10-CM

## 2017-02-17 DIAGNOSIS — Z0489 Encounter for examination and observation for other specified reasons: Secondary | ICD-10-CM | POA: Diagnosis present

## 2017-02-17 NOTE — ED Notes (Signed)
Pt ambulatory at d/c with all belongings. Pt left department before receiving discharge instructions.

## 2017-02-17 NOTE — ED Notes (Signed)
See EDP assessment 

## 2017-02-17 NOTE — ED Triage Notes (Signed)
Patient reported that the needle of insulin syringe broke while injecting Heroin at his left arm this evening , no bleeding/mild redness at antecubital area .

## 2017-02-17 NOTE — ED Provider Notes (Signed)
[facility] Provider Note   CSN: [id number] Arrival date & time: 02/17/17  0033     History   Chief Complaint Chief Complaint  Patient presents with  . Needle Stuck in Arm    Heroin Addiction     HPI Elijah Hall is a 21 y.o. male.  Patient presents to the emergency department with a chief complaint of foreign body in right arm.  Patient is an IV drug user.  He states that the needle broke in his arm.  He reports this happened several hours ago.  He has not seen anyone for this before.  He denies any symptoms.   The history is provided by the patient. No language interpreter was used.    History reviewed. No pertinent past medical history.  There are no active problems to display for this patient.   Past Surgical History:  Procedure Laterality Date  . TONSILLECTOMY         Home Medications    Prior to Admission medications   Medication Sig Start Date End Date Taking? Authorizing Provider  clindamycin (CLEOCIN) 300 MG capsule Take 1 capsule (300 mg total) by mouth 3 (three) times daily. 05/11/15   Rebecka ApleyWebster, Allison P, MD  traMADol (ULTRAM) 50 MG tablet Take 1 tablet (50 mg total) by mouth every 6 (six) hours as needed. 05/11/15   Rebecka ApleyWebster, Allison P, MD    Family History No family history on file.  Social History Social History   Tobacco Use  . Smoking status: Current Some Day Smoker    Packs/day: 0.00    Years: 5.00    Pack years: 0.00    Types: Cigarettes  . Smokeless tobacco: Never Used  Substance Use Topics  . Alcohol use: Yes    Comment: rarely  . Drug use: Yes    Types: IV    Comment: Heroin Addiction      Allergies   Sulfa antibiotics   Review of Systems Review of Systems  All other systems reviewed and are negative.    Physical Exam Updated Vital Signs BP 135/77 (BP Location: Left Arm)   Pulse 68   Temp 98.2 F (36.8 C) (Oral)   Resp 18   SpO2 100%   Physical Exam  Constitutional: He is  oriented to person, place, and time. He appears well-developed and well-nourished.  HENT:  Head: Normocephalic and atraumatic.  Eyes: Conjunctivae and EOM are normal.  Neck: Normal range of motion.  Cardiovascular: Normal rate.  Pulmonary/Chest: Effort normal.  Abdominal: He exhibits no distension.  Musculoskeletal: Normal range of motion.  Neurological: He is alert and oriented to person, place, and time.  Skin: Skin is dry.  Psychiatric: He has a normal mood and affect. His behavior is normal. Judgment and thought content normal.  Nursing note and vitals reviewed.    ED Treatments / Results  Labs (all labs ordered are listed, but only abnormal results are displayed) Labs Reviewed - No data to display  EKG  EKG Interpretation None       Radiology Dg Elbow Complete Right  Result Date: 02/17/2017 CLINICAL DATA:  Needle in arm for 5 hours. EXAM: RIGHT ELBOW - COMPLETE 3+ VIEW COMPARISON:  RIGHT elbow radiographs February 11, 2011 FINDINGS: No acute fracture deformity or dislocation. No destructive bony lesions. Soft tissue planes are not suspicious. No radiopaque foreign bodies. IMPRESSION: Negative. Electronically Signed   By: Awilda Metroourtnay  Bloomer M.D.   On: 02/17/2017 03:27    Procedures Procedures (including  critical care time)  Medications Ordered in ED Medications - No data to display   Initial Impression / Assessment and Plan / ED Course  I have reviewed the triage vital signs and the nursing notes.  Pertinent labs & imaging results that were available during my care of the patient were reviewed by me and considered in my medical decision making (see chart for details).    Patient is an IV drug user, who thought that his needle broke off in his arm tonight.  X-ray shows no evidence of needle. Dc to home with PCP follow-up.  Final Clinical Impressions(s) / ED Diagnoses   Final diagnoses:  Foreign body entering through skin, initial encounter    ED Discharge  Orders    None       Roxy HorsemanBrowning, Grayton Lobo, PA-C 02/17/17 [id number]    Geoffery Lyonselo, Douglas, MD 02/17/17 [id number]

## 2017-02-17 NOTE — Discharge Instructions (Signed)
Don't use IV drugs.  They are harmful and can kill you.

## 2017-12-20 ENCOUNTER — Emergency Department (HOSPITAL_COMMUNITY)
Admission: EM | Admit: 2017-12-20 | Discharge: 2017-12-20 | Disposition: A | Discharge status: Home / Self Care | Payer: Federal, State, Local not specified - PPO | Attending: Emergency Medicine | Admitting: Emergency Medicine

## 2017-12-20 ENCOUNTER — Encounter (HOSPITAL_COMMUNITY): Payer: Self-pay | Admitting: Emergency Medicine

## 2017-12-20 ENCOUNTER — Other Ambulatory Visit: Payer: Self-pay

## 2017-12-20 ENCOUNTER — Emergency Department (HOSPITAL_COMMUNITY): Payer: Federal, State, Local not specified - PPO

## 2017-12-20 DIAGNOSIS — L0291 Cutaneous abscess, unspecified: Secondary | ICD-10-CM

## 2017-12-20 DIAGNOSIS — L02414 Cutaneous abscess of left upper limb: Secondary | ICD-10-CM | POA: Diagnosis not present

## 2017-12-20 MED ORDER — DOXYCYCLINE HYCLATE 100 MG PO CAPS: 100.0000 mg | ORAL_CAPSULE | Freq: Two times a day (BID) | ORAL | 0 refills | Status: DC

## 2017-12-20 MED ORDER — DOXYCYCLINE HYCLATE 100 MG PO TABS
100.0000 mg | ORAL_TABLET | Freq: Once | ORAL | Status: AC
  Administered 2017-12-20: 100 mg via ORAL
  Filled 2017-12-20: qty 1

## 2017-12-20 MED ORDER — LIDOCAINE-EPINEPHRINE 1 %-1:100000 IJ SOLN
20.0000 mL | Freq: Once | INTRAMUSCULAR | Status: AC
  Administered 2017-12-20: 20 mL
  Filled 2017-12-20 (×2): qty 20

## 2017-12-20 NOTE — Discharge Instructions (Addendum)
Please read attached information. If you experience any new or worsening signs or symptoms please return to the emergency room for evaluation. Please follow-up with your primary care provider or specialist as discussed. Please use medication prescribed only as directed and discontinue taking if you have any concerning signs or symptoms.   °

## 2017-12-20 NOTE — ED Triage Notes (Addendum)
Pt with left antecubital abscess that began 1 week ago after using IV heroin. The area is swollen without redness or drainage.

## 2017-12-20 NOTE — ED Provider Notes (Signed)
[facility] EMERGENCY DEPARTMENT Provider Note   CSN: [id number] Arrival date & time: 12/20/17  1052     History   Chief Complaint Chief Complaint  Patient presents with  . Abscess    HPI Elijah Hall is a 22 y.o. male.  HPI   22 year old male presents today with abscess to left antecubital space.  Patient notes symptoms started approximate 1 week ago after using IV drugs.  Patient notes that it increased over the last several days, with severe pain and difficulty with range of motion of the elbow.  Patient denies any overlying redness, fever chills nausea or vomiting.  Patient does note history of the same.  History reviewed. No pertinent past medical history.  There are no active problems to display for this patient.   Past Surgical History:  Procedure Laterality Date  . TONSILLECTOMY          Home Medications    Prior to Admission medications   Medication Sig Start Date End Date Taking? Authorizing Provider  clindamycin (CLEOCIN) 300 MG capsule Take 1 capsule (300 mg total) by mouth 3 (three) times daily. 05/11/15   Rebecka Apley, MD  doxycycline (VIBRAMYCIN) 100 MG capsule Take 1 capsule (100 mg total) by mouth 2 (two) times daily. 12/20/17   Placido Hangartner, Tinnie Gens, PA-C  traMADol (ULTRAM) 50 MG tablet Take 1 tablet (50 mg total) by mouth every 6 (six) hours as needed. 05/11/15   Rebecka Apley, MD    Family History No family history on file.  Social History Social History   Tobacco Use  . Smoking status: Current Some Day Smoker    Packs/day: 0.50    Years: 5.00    Pack years: 2.50    Types: Cigarettes  . Smokeless tobacco: Never Used  Substance Use Topics  . Alcohol use: Yes    Comment: rarely  . Drug use: Yes    Types: IV    Comment: Heroin Addiction      Allergies   Sulfa antibiotics   Review of Systems Review of Systems  All other systems reviewed and are negative.    Physical Exam Updated Vital Signs BP 109/65 (BP  Location: Right Arm)   Pulse 69   Temp 98 F (36.7 C) (Oral)   Resp 15   Ht 6' (1.829 m)   Wt 72.6 kg   SpO2 98%   BMI 21.70 kg/m   Physical Exam  Constitutional: He is oriented to person, place, and time. He appears well-developed and well-nourished.  HENT:  Head: Normocephalic and atraumatic.  Eyes: Pupils are equal, round, and reactive to light. Conjunctivae are normal. Right eye exhibits no discharge. Left eye exhibits no discharge. No scleral icterus.  Neck: Normal range of motion. No JVD present. No tracheal deviation present.  Pulmonary/Chest: Effort normal. No stridor.  Musculoskeletal:  Left antecubital fossa with swelling approximately 2 cm, firm no fluctuance, no overlying redness or discharge-flexion of the elbow intact difficulty with extension secondary to discomfort  Neurological: He is alert and oriented to person, place, and time. Coordination normal.  Psychiatric: He has a normal mood and affect. His behavior is normal. Judgment and thought content normal.  Nursing note and vitals reviewed.    ED Treatments / Results  Labs (all labs ordered are listed, but only abnormal results are displayed) Labs Reviewed  AEROBIC/ANAEROBIC CULTURE (SURGICAL/DEEP WOUND)    EKG None  Radiology Dg Elbow Complete Left  Result Date: 12/20/2017 CLINICAL DATA:  Swelling of the  anterior elbow, IV drug user, possible infection EXAM: LEFT ELBOW - COMPLETE 3+ VIEW COMPARISON:  None. FINDINGS: Alignment is normal. The elbow joint spaces appear normal. No fracture seen and no joint effusion is noted. IMPRESSION: Negative. Electronically Signed   By: Dwyane Dee M.D.   On: 12/20/2017 14:49    Procedures .Marland KitchenIncision and Drainage Date/Time: 12/20/2017 3:36 PM Performed by: Eyvonne Mechanic, PA-C Authorized by: Eyvonne Mechanic, PA-C   Consent:    Consent obtained:  Verbal   Consent given by:  Patient   Risks discussed:  Bleeding, incomplete drainage, infection, damage to other  organs and pain   Alternatives discussed:  No treatment, delayed treatment and alternative treatment Location:    Type:  Abscess   Size:  2   Location: Left arm. Pre-procedure details:    Skin preparation:  Betadine and Chloraprep Anesthesia (see MAR for exact dosages):    Anesthesia method:  Local infiltration   Local anesthetic:  Lidocaine 2% WITH epi Procedure type:    Complexity:  Simple Procedure details:    Needle aspiration: yes     Needle size:  18 G   Incision types:  Single straight   Scalpel blade:  11   Wound management:  Probed and deloculated   Drainage:  Purulent and bloody   Drainage amount:  Moderate   Wound treatment:  Wound left open   Packing materials:  None Post-procedure details:    Patient tolerance of procedure:  Tolerated well, no immediate complications   (including critical care time)  Medications Ordered in ED Medications  doxycycline (VIBRA-TABS) tablet 100 mg (has no administration in time range)  lidocaine-EPINEPHrine (XYLOCAINE W/EPI) 1 %-1:100000 (with pres) injection 20 mL (20 mLs Infiltration Given by Other 12/20/17 1359)     Initial Impression / Assessment and Plan / ED Course  I have reviewed the triage vital signs and the nursing notes.  Pertinent labs & imaging results that were available during my care of the patient were reviewed by me and considered in my medical decision making (see chart for details).     Labs: Anaerobic aerobic culture  Imaging: DG elbow  Consults:  Therapeutics: Doxycycline and lidocaine with epinephrine  Discharge Meds: Doxycycline  Assessment/Plan:   22 year old male presents today with an abscess to his left arm.  I&D successful, culture sent.  No signs of systemic illness range of motion increased in the arm, no signs of deep space infection.  Discharged with antibiotics, strict return precautions and follow-up information.  Patient verbalized understanding and agreement to today's plan had no  further questions or concerns the time discharge.  Final Clinical Impressions(s) / ED Diagnoses   Final diagnoses:  Abscess    ED Discharge Orders         Ordered    doxycycline (VIBRAMYCIN) 100 MG capsule  2 times daily     12/20/17 1538           Eudora Guevarra, [place], PA-C 12/20/17 1540    Mancel Bale, MD 12/22/17 1409

## 2017-12-24 LAB — AEROBIC/ANAEROBIC CULTURE W GRAM STAIN (SURGICAL/DEEP WOUND): Special Requests: NORMAL

## 2017-12-24 LAB — AEROBIC/ANAEROBIC CULTURE (SURGICAL/DEEP WOUND)

## 2017-12-25 ENCOUNTER — Encounter (HOSPITAL_COMMUNITY): Payer: Self-pay

## 2017-12-25 ENCOUNTER — Telehealth: Payer: Self-pay | Admitting: Emergency Medicine

## 2017-12-25 ENCOUNTER — Other Ambulatory Visit: Payer: Self-pay

## 2017-12-25 ENCOUNTER — Emergency Department (HOSPITAL_COMMUNITY)
Admission: EM | Admit: 2017-12-25 | Discharge: 2017-12-25 | Disposition: A | Discharge status: Home / Self Care | Payer: Federal, State, Local not specified - PPO | Attending: Emergency Medicine | Admitting: Emergency Medicine

## 2017-12-25 DIAGNOSIS — I808 Phlebitis and thrombophlebitis of other sites: Secondary | ICD-10-CM | POA: Diagnosis not present

## 2017-12-25 DIAGNOSIS — L0291 Cutaneous abscess, unspecified: Secondary | ICD-10-CM

## 2017-12-25 DIAGNOSIS — R2232 Localized swelling, mass and lump, left upper limb: Secondary | ICD-10-CM | POA: Insufficient documentation

## 2017-12-25 DIAGNOSIS — I809 Phlebitis and thrombophlebitis of unspecified site: Secondary | ICD-10-CM

## 2017-12-25 DIAGNOSIS — F17210 Nicotine dependence, cigarettes, uncomplicated: Secondary | ICD-10-CM | POA: Insufficient documentation

## 2017-12-25 HISTORY — DX: Other psychoactive substance abuse, uncomplicated: F19.10

## 2017-12-25 LAB — CBC WITH DIFFERENTIAL/PLATELET
Abs Immature Granulocytes: 0 10*3/uL (ref 0.0–0.1)
Basophils Absolute: 0.1 10*3/uL (ref 0.0–0.1)
Basophils Relative: 1 %
EOS PCT: 2 %
Eosinophils Absolute: 0.2 10*3/uL (ref 0.0–0.7)
HEMATOCRIT: 45 % (ref 39.0–52.0)
HEMOGLOBIN: 14.8 g/dL (ref 13.0–17.0)
Immature Granulocytes: 0 %
LYMPHS ABS: 2 10*3/uL (ref 0.7–4.0)
Lymphocytes Relative: 26 %
MCH: 28 pg (ref 26.0–34.0)
MCHC: 32.9 g/dL (ref 30.0–36.0)
MCV: 85.2 fL (ref 78.0–100.0)
Monocytes Absolute: 0.7 10*3/uL (ref 0.1–1.0)
Monocytes Relative: 9 %
NEUTROS ABS: 4.9 10*3/uL (ref 1.7–7.7)
Neutrophils Relative %: 62 %
Platelets: 330 10*3/uL (ref 150–400)
RBC: 5.28 MIL/uL (ref 4.22–5.81)
RDW: 14.1 % (ref 11.5–15.5)
WBC: 7.8 10*3/uL (ref 4.0–10.5)

## 2017-12-25 LAB — COMPREHENSIVE METABOLIC PANEL
ALT: 11 U/L (ref 0–44)
AST: 17 U/L (ref 15–41)
Albumin: 4.1 g/dL (ref 3.5–5.0)
Alkaline Phosphatase: 58 U/L (ref 38–126)
Anion gap: 8 (ref 5–15)
BILIRUBIN TOTAL: 0.5 mg/dL (ref 0.3–1.2)
BUN: 10 mg/dL (ref 6–20)
CHLORIDE: 104 mmol/L (ref 98–111)
CO2: 24 mmol/L (ref 22–32)
Calcium: 9.6 mg/dL (ref 8.9–10.3)
Creatinine, Ser: 0.92 mg/dL (ref 0.61–1.24)
GFR calc Af Amer: >60 mL/min (ref >60)
GFR calc non Af Amer: >60 mL/min (ref >60)
Glucose, Bld: 117 mg/dL — ABNORMAL HIGH (ref 70–99)
Potassium: 4.1 mmol/L (ref 3.5–5.1)
Sodium: 136 mmol/L (ref 135–145)
TOTAL PROTEIN: 8 g/dL (ref 6.5–8.1)

## 2017-12-25 LAB — I-STAT CG4 LACTIC ACID, ED: Lactic Acid, Venous: 1.58 mmol/L (ref 0.5–1.9)

## 2017-12-25 MED ORDER — CEPHALEXIN 250 MG PO CAPS
1000.0000 mg | ORAL_CAPSULE | Freq: Once | ORAL | Status: AC
  Administered 2017-12-25: 1000 mg via ORAL
  Filled 2017-12-25: qty 4

## 2017-12-25 MED ORDER — CEPHALEXIN 500 MG PO CAPS: 500.0000 mg | ORAL_CAPSULE | Freq: Four times a day (QID) | ORAL | 0 refills | Status: DC

## 2017-12-25 MED ORDER — ACETAMINOPHEN 500 MG PO TABS
1000.0000 mg | ORAL_TABLET | Freq: Once | ORAL | Status: AC
  Administered 2017-12-25: 1000 mg via ORAL
  Filled 2017-12-25: qty 2

## 2017-12-25 MED ORDER — IBUPROFEN 800 MG PO TABS
800.0000 mg | ORAL_TABLET | Freq: Once | ORAL | Status: AC
  Administered 2017-12-25: 800 mg via ORAL
  Filled 2017-12-25: qty 1

## 2017-12-25 NOTE — Discharge Instructions (Addendum)
There is help if you need it.  Please do not use dirty needles, this could cause you a severe infection to your skin, heart or spinal cord.  This could kill you or leave you permanently disabled.   ° °[place] [facility]ioid Problem ([facility]) °Fixed; mobile; peer-based °Chase Holleman °[phone] °[email or web address] °Fixed site exchange at [employer], Wednesdays (2-5pm) and Thursdays (3-8pm). °[address] °[place], [place] [address] °Call or text to arrange mobile and peer exchange, Mondays (1-4pm) and Fridays (4-7pm). °Serving [place] ° °

## 2017-12-25 NOTE — Progress Notes (Signed)
ED Antimicrobial Stewardship Positive Culture Follow Up   Elijah Hall is an 22 y.o. male who presented to [facility] on 12/20/2017 with a chief complaint of  Chief Complaint  Patient presents with  . Abscess    Recent Results (from the past 720 hour(s))  Aerobic/Anaerobic Culture (surgical/deep wound)     Status: None   Collection Time: 12/20/17  3:03 PM  Result Value Ref Range Status   Specimen Description ABSCESS ARM  Final   Special Requests Normal  Final   Gram Stain   Final    MODERATE WBC PRESENT,BOTH PMN AND MONONUCLEAR ABUNDANT GRAM POSITIVE COCCI FEW GRAM NEGATIVE RODS RARE GRAM POSITIVE RODS    Culture   Final    FEW ENTEROBACTER AEROGENES FEW STAPHYLOCOCCUS AUREUS FEW PREVOTELLA SPECIES BETA LACTAMASE POSITIVE Performed at [facility], [address], [place], [place] [address]    Report Status 12/24/2017 FINAL  Final   Organism ID, Bacteria ENTEROBACTER AEROGENES  Final   Organism ID, Bacteria STAPHYLOCOCCUS AUREUS  Final      Susceptibility   Enterobacter aerogenes - MIC*    CEFAZOLIN RESISTANT Resistant     CEFEPIME <=1 SENSITIVE Sensitive     CEFTAZIDIME <=1 SENSITIVE Sensitive     CEFTRIAXONE <=1 SENSITIVE Sensitive     CIPROFLOXACIN <=0.25 SENSITIVE Sensitive     GENTAMICIN <=1 SENSITIVE Sensitive     IMIPENEM 1 SENSITIVE Sensitive     TRIMETH/SULFA <=20 SENSITIVE Sensitive     PIP/TAZO <=4 SENSITIVE Sensitive     * FEW ENTEROBACTER AEROGENES   Staphylococcus aureus - MIC*    CIPROFLOXACIN 2 INTERMEDIATE Intermediate     ERYTHROMYCIN RESISTANT Resistant     GENTAMICIN <=0.5 SENSITIVE Sensitive     OXACILLIN 0.5 SENSITIVE Sensitive     TETRACYCLINE <=1 SENSITIVE Sensitive     VANCOMYCIN <=0.5 SENSITIVE Sensitive     TRIMETH/SULFA <=10 SENSITIVE Sensitive     CLINDAMYCIN RESISTANT Resistant     RIFAMPIN <=0.5 SENSITIVE Sensitive     Inducible Clindamycin POSITIVE Resistant     * FEW STAPHYLOCOCCUS AUREUS    Patient treated with doxycycline  100 mg BID for antecubital abscess post I&D. Per cultures, doxycyline is treating MSSA and Prevotella species, but no coverage for Enterobacter aerogenes.   Plan: Call patient for symptom check (pain, redness, purulence). If symptoms have not improved, start cefdinir 300 mg po BID x 5 days.   ED Provider: Meridee Score, MD   Lyanne Co 12/25/2017, 9:02 AM PharmD Candidate Monday - Friday phone -  [phone] Saturday - Sunday phone - [phone]

## 2017-12-25 NOTE — Telephone Encounter (Signed)
Post ED Visit - Positive Culture Follow-up: Successful Patient Follow-Up  Culture assessed and recommendations reviewed by:  []  Enzo Bi, Pharm.D. []  Celedonio Miyamoto, Pharm.D., BCPS AQ-ID []  Garvin Fila, Pharm.D., BCPS []  Georgina Pillion, Pharm.D., BCPS []  [place], [address]D., BCPS, AAHIVP []  Estella Husk, Pharm.D., BCPS, AAHIVP []  Lysle Pearl, PharmD, BCPS []  Phillips Climes, PharmD, BCPS []  Agapito Games, PharmD, BCPS []  Verlan Friends, PharmD Lyanne Co PharmD  Positive wound culture  []  Patient discharged without antimicrobial prescription and treatment is now indicated []  Organism is resistant to prescribed ED discharge antimicrobial []  Patient with positive blood cultures  Changes discussed with ED provider: Meridee Score MD New antibiotic prescription symptom check, if worse start Cefdinir 300mg  po bid x 5 days  Attempting to contact patient    Berle Mull 12/25/2017, 1:52 PM

## 2017-12-25 NOTE — ED Provider Notes (Signed)
[facility] EMERGENCY DEPARTMENT Provider Note   CSN: [id number] Arrival date & time: 12/25/17  1144     History   Chief Complaint Chief Complaint  Patient presents with  . Abscess    HPI Elijah Hall is a 22 y.o. male.  22 yo M with a chief complaint of an abscess.  This was in the left [facility] after using heroin.  He had this lanced over a week ago.  Was started on doxycycline.  Since then he feels that the redness is gone down but he has swelling more on the upper arm.  He denies fevers or chills.  Has become more swollen and painful.  Worsening over the past couple days.  The history is provided by the patient.  Abscess  Associated symptoms: no fever, no headaches and no vomiting   Illness  This is a new problem. The current episode started more than 1 week ago. The problem occurs constantly. The problem has been gradually worsening. Pertinent negatives include no chest pain, no abdominal pain, no headaches and no shortness of breath. Nothing aggravates the symptoms. Nothing relieves the symptoms. He has tried nothing for the symptoms. The treatment provided no relief.    Past Medical History:  Diagnosis Date  . IV drug abuse (HCC)     There are no active problems to display for this patient.   Past Surgical History:  Procedure Laterality Date  . TONSILLECTOMY          Home Medications    Prior to Admission medications   Medication Sig Start Date End Date Taking? Authorizing Provider  cephALEXin (KEFLEX) 500 MG capsule Take 1 capsule (500 mg total) by mouth 4 (four) times daily. 12/25/17   Melene Plan, DO  clindamycin (CLEOCIN) 300 MG capsule Take 1 capsule (300 mg total) by mouth 3 (three) times daily. 05/11/15   Rebecka Apley, MD  doxycycline (VIBRAMYCIN) 100 MG capsule Take 1 capsule (100 mg total) by mouth 2 (two) times daily. 12/20/17   Hedges, Tinnie Gens, PA-C  traMADol (ULTRAM) 50 MG tablet Take 1 tablet (50 mg total) by mouth every 6 (six) hours  as needed. 05/11/15   Rebecka Apley, MD    Family History History reviewed. No pertinent family history.  Social History Social History   Tobacco Use  . Smoking status: Current Some Day Smoker    Packs/day: 0.50    Years: 5.00    Pack years: 2.50    Types: Cigarettes  . Smokeless tobacco: Never Used  Substance Use Topics  . Alcohol use: Yes    Comment: rarely  . Drug use: Yes    Types: IV    Comment: Heroin Addiction      Allergies   Sulfa antibiotics   Review of Systems Review of Systems  Constitutional: Negative for chills and fever.  HENT: Negative for congestion and facial swelling.   Eyes: Negative for discharge and visual disturbance.  Respiratory: Negative for shortness of breath.   Cardiovascular: Negative for chest pain and palpitations.  Gastrointestinal: Negative for abdominal pain, diarrhea and vomiting.  Musculoskeletal: Positive for arthralgias and myalgias.  Skin: Negative for color change and rash.  Neurological: Negative for tremors, syncope and headaches.  Psychiatric/Behavioral: Negative for confusion and dysphoric mood.     Physical Exam Updated Vital Signs BP 116/61 (BP Location: Right Arm)   Pulse 83   Temp 98.8 F (37.1 C) (Oral)   Resp 18   Ht 6' (1.829 m)  Wt 70.3 kg   SpO2 100%   BMI 21.02 kg/m   Physical Exam  Constitutional: He is oriented to person, place, and time. He appears well-developed and well-nourished.  HENT:  Head: Normocephalic and atraumatic.  Eyes: Pupils are equal, round, and reactive to light. EOM are normal.  Neck: Normal range of motion. Neck supple. No JVD present.  Cardiovascular: Normal rate and regular rhythm. Exam reveals no gallop and no friction rub.  No murmur heard. Pulmonary/Chest: No respiratory distress. He has no wheezes.  Abdominal: He exhibits no distension. There is no rebound and no guarding.  Musculoskeletal: Normal range of motion. He exhibits edema and tenderness.  Small amount  of erythema and ulceration at the left [facility].  Patient has some pain along where the brachial vein extends up into the mid arm.  There is some edema there.  No erythema.  Pulse motor and sensation is intact distally.  Neurological: He is alert and oriented to person, place, and time.  Skin: No rash noted. No pallor.  Psychiatric: He has a normal mood and affect. His behavior is normal.  Nursing note and vitals reviewed.    ED Treatments / Results  Labs (all labs ordered are listed, but only abnormal results are displayed) Labs Reviewed  COMPREHENSIVE METABOLIC PANEL - Abnormal; Notable for the following components:      Result Value   Glucose, Bld 117 (*)    All other components within normal limits  CBC WITH DIFFERENTIAL/PLATELET  I-STAT CG4 LACTIC ACID, ED    EKG None  Radiology No results found.  Procedures Procedures (including critical care time)  Medications Ordered in ED Medications  cephALEXin (KEFLEX) capsule 1,000 mg (1,000 mg Oral Given 12/25/17 1304)  acetaminophen (TYLENOL) tablet 1,000 mg (1,000 mg Oral Given 12/25/17 1304)  ibuprofen (ADVIL,MOTRIN) tablet 800 mg (800 mg Oral Given 12/25/17 1304)     Initial Impression / Assessment and Plan / ED Course  I have reviewed the triage vital signs and the nursing notes.  Pertinent labs & imaging results that were available during my care of the patient were reviewed by me and considered in my medical decision making (see chart for details).     22 yo M with a chief complaint of left arm pain and swelling.  Going on for the past week.  Had an I&D performed and was doing better until couple days ago when it became more swollen.  He has some pain and swelling to the left upper arm but there is no erythema he is not febrile.  I doubt that he has a deep space abscess in that area.  He had a cultures and sensitivities that was done on his I&D that showed that parts of the bacteria was not treated by doxycycline.  I will start  him on Keflex to go on top of the doxycycline.  We will have him follow-up with his PCP.  Continue doing Tylenol and NSAIDs and warm compresses.  No leukocytosis, mildly elevated glucose level and CMP.  Lactate is normal.  Discharge home.  1:16 PM:  I have discussed the diagnosis/risks/treatment options with the patient and family and believe the pt to be eligible for discharge home to follow-up with PCP. We also discussed returning to the ED immediately if new or worsening sx occur. We discussed the sx which are most concerning (e.g., sudden worsening pain, fever, inability to tolerate by mouth) that necessitate immediate return. Medications administered to the patient during their visit and any  new prescriptions provided to the patient are listed below.  Medications given during this visit Medications  cephALEXin (KEFLEX) capsule 1,000 mg (1,000 mg Oral Given 12/25/17 1304)  acetaminophen (TYLENOL) tablet 1,000 mg (1,000 mg Oral Given 12/25/17 1304)  ibuprofen (ADVIL,MOTRIN) tablet 800 mg (800 mg Oral Given 12/25/17 1304)      The patient appears reasonably screen and/or stabilized for discharge and I doubt any other medical condition or other [facility] requiring further screening, evaluation, or treatment in the ED at this time prior to discharge.    Final Clinical Impressions(Hall) / ED Diagnoses   Final diagnoses:  Phlebitis  Abscess    ED Discharge Orders         Ordered    cephALEXin (KEFLEX) 500 MG capsule  4 times daily     12/25/17 1309           Melene Plan, DO 12/25/17 1316

## 2017-12-25 NOTE — ED Triage Notes (Signed)
Pt had left ac abscess due to IV heroin use. Was here recently  And had it lanced and drained, pt now has pain further up the arm with swelling. VSS.

## 2017-12-30 ENCOUNTER — Inpatient Hospital Stay (HOSPITAL_COMMUNITY): Payer: Federal, State, Local not specified - PPO

## 2017-12-30 ENCOUNTER — Inpatient Hospital Stay (HOSPITAL_COMMUNITY)
Admission: EM | Admit: 2017-12-30 | Discharge: 2018-01-02 | DRG: 603 | Disposition: A | Discharge status: Home / Self Care | Payer: Federal, State, Local not specified - PPO | Attending: Family Medicine | Admitting: Family Medicine

## 2017-12-30 ENCOUNTER — Other Ambulatory Visit: Payer: Self-pay

## 2017-12-30 ENCOUNTER — Emergency Department (HOSPITAL_COMMUNITY): Payer: Federal, State, Local not specified - PPO

## 2017-12-30 ENCOUNTER — Encounter (HOSPITAL_COMMUNITY): Payer: Self-pay | Admitting: Emergency Medicine

## 2017-12-30 DIAGNOSIS — L03119 Cellulitis of unspecified part of limb: Secondary | ICD-10-CM | POA: Diagnosis not present

## 2017-12-30 DIAGNOSIS — L02414 Cutaneous abscess of left upper limb: Principal | ICD-10-CM | POA: Diagnosis present

## 2017-12-30 DIAGNOSIS — F1990 Other psychoactive substance use, unspecified, uncomplicated: Secondary | ICD-10-CM | POA: Diagnosis present

## 2017-12-30 DIAGNOSIS — E876 Hypokalemia: Secondary | ICD-10-CM | POA: Diagnosis present

## 2017-12-30 DIAGNOSIS — L02419 Cutaneous abscess of limb, unspecified: Secondary | ICD-10-CM | POA: Diagnosis present

## 2017-12-30 DIAGNOSIS — M25522 Pain in left elbow: Secondary | ICD-10-CM | POA: Diagnosis present

## 2017-12-30 DIAGNOSIS — Z8614 Personal history of Methicillin resistant Staphylococcus aureus infection: Secondary | ICD-10-CM

## 2017-12-30 DIAGNOSIS — L0291 Cutaneous abscess, unspecified: Secondary | ICD-10-CM

## 2017-12-30 DIAGNOSIS — F17210 Nicotine dependence, cigarettes, uncomplicated: Secondary | ICD-10-CM | POA: Diagnosis present

## 2017-12-30 DIAGNOSIS — M609 Myositis, unspecified: Secondary | ICD-10-CM | POA: Diagnosis present

## 2017-12-30 DIAGNOSIS — F1110 Opioid abuse, uncomplicated: Secondary | ICD-10-CM | POA: Diagnosis present

## 2017-12-30 DIAGNOSIS — L03114 Cellulitis of left upper limb: Secondary | ICD-10-CM | POA: Diagnosis present

## 2017-12-30 HISTORY — DX: Bacteremia: R78.81

## 2017-12-30 HISTORY — DX: Other psychoactive substance use, unspecified, uncomplicated: F19.90

## 2017-12-30 HISTORY — DX: Methicillin susceptible Staphylococcus aureus infection as the cause of diseases classified elsewhere: B95.61

## 2017-12-30 LAB — CBC WITH DIFFERENTIAL/PLATELET
ABS IMMATURE GRANULOCYTES: 0.06 10*3/uL (ref 0.00–0.07)
BASOS ABS: 0 10*3/uL (ref 0.0–0.1)
Basophils Relative: 0 %
EOS PCT: 2 %
Eosinophils Absolute: 0.2 10*3/uL (ref 0.0–0.5)
HCT: 40.6 % (ref 39.0–52.0)
HEMOGLOBIN: 13 g/dL (ref 13.0–17.0)
Immature Granulocytes: 1 %
LYMPHS ABS: 2.2 10*3/uL (ref 0.7–4.0)
LYMPHS PCT: 22 %
MCH: 26.8 pg (ref 26.0–34.0)
MCHC: 32 g/dL (ref 30.0–36.0)
MCV: 83.7 fL (ref 80.0–100.0)
MONO ABS: 1 10*3/uL (ref 0.1–1.0)
Monocytes Relative: 10 %
NEUTROS ABS: 6.8 10*3/uL (ref 1.7–7.7)
NRBC: 0 % (ref 0.0–0.2)
Neutrophils Relative %: 65 %
PLATELETS: 385 10*3/uL (ref 150–400)
RBC: 4.85 MIL/uL (ref 4.22–5.81)
RDW: 14 % (ref 11.5–15.5)
WBC: 10.3 10*3/uL (ref 4.0–10.5)

## 2017-12-30 LAB — RAPID URINE DRUG SCREEN, HOSP PERFORMED
AMPHETAMINES: POSITIVE — AB
BARBITURATES: NOT DETECTED
Benzodiazepines: NOT DETECTED
Cocaine: NOT DETECTED
OPIATES: POSITIVE — AB
TETRAHYDROCANNABINOL: NOT DETECTED

## 2017-12-30 LAB — COMPREHENSIVE METABOLIC PANEL
ALT: 18 U/L (ref 0–44)
AST: 20 U/L (ref 15–41)
Albumin: 3.8 g/dL (ref 3.5–5.0)
Alkaline Phosphatase: 61 U/L (ref 38–126)
Anion gap: 11 (ref 5–15)
BUN: 9 mg/dL (ref 6–20)
CHLORIDE: 105 mmol/L (ref 98–111)
CO2: 23 mmol/L (ref 22–32)
CREATININE: 0.89 mg/dL (ref 0.61–1.24)
Calcium: 9 mg/dL (ref 8.9–10.3)
GFR calc Af Amer: >60 mL/min (ref >60)
GFR calc non Af Amer: >60 mL/min (ref >60)
Glucose, Bld: 120 mg/dL — ABNORMAL HIGH (ref 70–99)
Potassium: 3.4 mmol/L — ABNORMAL LOW (ref 3.5–5.1)
SODIUM: 139 mmol/L (ref 135–145)
Total Bilirubin: 0.6 mg/dL (ref 0.3–1.2)
Total Protein: 7.9 g/dL (ref 6.5–8.1)

## 2017-12-30 LAB — I-STAT CG4 LACTIC ACID, ED: Lactic Acid, Venous: 1.11 mmol/L (ref 0.5–1.9)

## 2017-12-30 MED ORDER — ONDANSETRON HCL 4 MG PO TABS: 4.0000 mg | ORAL_TABLET | Freq: Four times a day (QID) | ORAL | Status: DC | PRN

## 2017-12-30 MED ORDER — KETOROLAC TROMETHAMINE 30 MG/ML IJ SOLN
30.0000 mg | Freq: Four times a day (QID) | INTRAMUSCULAR | Status: DC | PRN
  Administered 2017-12-30 – 2017-12-31 (×3): 30 mg via INTRAVENOUS
  Filled 2017-12-30 (×3): qty 1

## 2017-12-30 MED ORDER — ONDANSETRON HCL 4 MG/2ML IJ SOLN: 4.0000 mg | Freq: Four times a day (QID) | INTRAMUSCULAR | Status: DC | PRN

## 2017-12-30 MED ORDER — HYDROMORPHONE HCL 1 MG/ML IJ SOLN
1.0000 mg | Freq: Once | INTRAMUSCULAR | Status: AC
  Administered 2017-12-30: 1 mg via INTRAVENOUS
  Filled 2017-12-30: qty 1

## 2017-12-30 MED ORDER — OXYCODONE HCL 5 MG PO TABS
5.0000 mg | ORAL_TABLET | ORAL | Status: DC | PRN
  Administered 2017-12-30 – 2017-12-31 (×5): 5 mg via ORAL
  Filled 2017-12-30 (×5): qty 1

## 2017-12-30 MED ORDER — POTASSIUM CHLORIDE CRYS ER 20 MEQ PO TBCR
40.0000 meq | EXTENDED_RELEASE_TABLET | ORAL | Status: AC
  Administered 2017-12-30: 40 meq via ORAL
  Filled 2017-12-30: qty 2

## 2017-12-30 MED ORDER — ACETAMINOPHEN 650 MG RE SUPP: 650.0000 mg | Freq: Four times a day (QID) | RECTAL | Status: DC | PRN

## 2017-12-30 MED ORDER — VANCOMYCIN HCL IN DEXTROSE 1-5 GM/200ML-% IV SOLN
1000.0000 mg | Freq: Three times a day (TID) | INTRAVENOUS | Status: DC
  Administered 2017-12-30 (×2): 1000 mg via INTRAVENOUS
  Filled 2017-12-30 (×2): qty 200

## 2017-12-30 MED ORDER — SODIUM CHLORIDE 0.9 % IV SOLN
1.0000 g | Freq: Once | INTRAVENOUS | Status: AC
  Administered 2017-12-30: 1 g via INTRAVENOUS
  Filled 2017-12-30: qty 10

## 2017-12-30 MED ORDER — SODIUM CHLORIDE 0.9 % IV SOLN: 1.0000 g | INTRAVENOUS | Status: DC

## 2017-12-30 MED ORDER — METRONIDAZOLE IN NACL 5-0.79 MG/ML-% IV SOLN
500.0000 mg | Freq: Three times a day (TID) | INTRAVENOUS | Status: DC
  Administered 2017-12-30 – 2018-01-02 (×9): 500 mg via INTRAVENOUS
  Filled 2017-12-30 (×10): qty 100

## 2017-12-30 MED ORDER — ACETAMINOPHEN 325 MG PO TABS
650.0000 mg | ORAL_TABLET | Freq: Four times a day (QID) | ORAL | Status: DC | PRN
  Administered 2018-01-02 (×2): 650 mg via ORAL
  Filled 2017-12-30 (×2): qty 2

## 2017-12-30 MED ORDER — ERYTHROMYCIN 5 MG/GM OP OINT: TOPICAL_OINTMENT | Freq: Four times a day (QID) | OPHTHALMIC | Status: DC

## 2017-12-30 MED ORDER — GADOBUTROL 1 MMOL/ML IV SOLN
7.0000 mL | Freq: Once | INTRAVENOUS | Status: AC | PRN
  Administered 2017-12-30: 7 mL via INTRAVENOUS

## 2017-12-30 MED ORDER — SODIUM CHLORIDE 0.9 % IV SOLN
2.0000 g | INTRAVENOUS | Status: DC
  Administered 2017-12-30 – 2018-01-01 (×3): 2 g via INTRAVENOUS
  Filled 2017-12-30 (×4): qty 20

## 2017-12-30 MED ORDER — ENOXAPARIN SODIUM 40 MG/0.4ML ~~LOC~~ SOLN
40.0000 mg | Freq: Every day | SUBCUTANEOUS | Status: DC
  Administered 2017-12-30: 40 mg via SUBCUTANEOUS
  Filled 2017-12-30 (×3): qty 0.4

## 2017-12-30 NOTE — H&P (Signed)
History and Physical  Patient Name: Elijah Hall     ZOX:[id number]    DOB: 09-15-1995    DOA: 12/30/2017 PCP: Patient, No Pcp Per  Patient coming from: Home  Chief Complaint: Elbow infection      HPI: Elijah Hall is a 22 y.o. male with a past medical history significant for IVDU and hx MRSA bacteremia in 2016 at [facility] who presents with left antecubital redness, swelling, pain and purulent drainage.  The patient relapsed recently for a period of several months, had been using heroin daily.  Weaned himself with illicit Suboxone a few weeks ago, and then in the last 10 days, noticed new redness, swelling, and pain in his left antecubital fossa.  He went to the ER, had I&D and culture, discharged on doxycycline.  Culture grew Enterobacter and Prevotella and MSSA.    Had initial improvement after I&D, then worsening pain and redness.  Returned to ER, cephalexin was added to doxycycline.  Since then, he has been taking the doxycycline and cephalexin for the last 5 days without improvement.  Then last night, he noticed pus on his dressing, went to the bathroom and expressed "a _____ ton of pus out of it", followed by some blood.  Then he came to the ER.  ED course: -Afebrile, heart rate 94, respirations 22, blood pressure 100/65, pulse ox normal -Na 140, K 3.4, Cr 0.89, WBC 10.3 K, Hgb 13 - Lactate normal - Urinalysis showed opiates and amphetamines - X-ray showed no foreign body -Ultrasound showed a 6.2 cm complex fluid collection in the antecubital fossa -She was given vancomycin and ceftriaxone and TRH were asked to evaluate for admission       ROS: Review of Systems  Constitutional: Positive for malaise/fatigue. Negative for chills and fever.  Gastrointestinal: Negative for nausea and vomiting.  Skin:       Swelling, redness  Neurological: Negative for dizziness and loss of consciousness.  All other systems reviewed and are negative.         Past Medical History:  Diagnosis  Date  . IV drug abuse (HCC)   . IVDU (intravenous drug user)   . Staphylococcus aureus bacteremia    2016 at [facility]    Past Surgical History:  Procedure Laterality Date  . TONSILLECTOMY      Social History: Patient lives alone.  He works in a Engineering ge[employer].  The patient walks unassisted.  Smoker.  Allergies  Allergen Reactions  . Sulfa Antibiotics Swelling and Anaphylaxis    Family history: family history includes Stroke in his paternal grandmother.  Prior to Admission medications   Medication Sig Start Date End Date Taking? Authorizing Provider  acetaminophen (TYLENOL) 500 MG tablet Take 1,000 mg by mouth every 6 (six) hours as needed for mild pain.   Yes [provider]  cephALEXin (KEFLEX) 500 MG capsule Take 500 mg by mouth 4 (four) times daily.   Yes [provider]       Physical Exam: BP 97/82 (BP Location: Right Arm)   Pulse 69   Temp 98.4 F (36.9 C) (Oral)   Resp 16   SpO2 100%  General appearance: Well-developed, adult male, alert and in no acute distress.   Eyes: Anicteric, conjunctiva pink, lids and lashes normal. PERRL.    ENT: No nasal deformity, discharge, epistaxis.  Hearing normal. OP moist without lesions.   Neck: No neck masses.  Trachea midline.  No thyromegaly/tenderness. Lymph: No cervical or supraclavicular lymphadenopathy. Skin: Warm and dry.  No jaundice.  No suspicious rashes or lesions.  There is large redness and a punctate incision site from his I&D which is granulating, no active drainage.  The area is very indurated and painful and precludes appreciating fluctuance. Cardiac: RRR, nl S1-S2, no murmurs appreciated.  Capillary refill is brisk.  JVP normal.  No LE edema.  Radial pulses 2+ and symmetric. Respiratory: Normal respiratory rate and rhythm.  CTAB without rales or wheezes. Abdomen: Abdomen soft.  No TTP. No ascites, distension, hepatosplenomegaly.   MSK: No deformities or effusions.  No cyanosis or clubbing.  Not  able to extend left elbow past halfway.  No distal paresthesias, good cap refill, no pain with movement of fingers. Neuro: Cranial nerves normal.  Sensation intact to light touch. Speech is fluent.  Muscle strength normal.    Psych: Sensorium intact and responding to questions, attention normal.  Behavior appropriate.  Affect normal.  Judgment and insight appear normal.     Labs on Admission:  I have personally reviewed following labs and imaging studies: CBC: Recent Labs  Lab 12/25/17 1210 12/30/17 0055  WBC 7.8 10.3  NEUTROABS 4.9 6.8  HGB 14.8 13.0  HCT 45.0 40.6  MCV 85.2 83.7  PLT 330 385   Basic Metabolic Panel: Recent Labs  Lab 12/25/17 1210 12/30/17 0055  NA 136 139  K 4.1 3.4*  CL 104 105  CO2 24 23  GLUCOSE 117* 120*  BUN 10 9  CREATININE 0.92 0.89  CALCIUM 9.6 9.0   GFR: Estimated Creatinine Clearance: 127.3 mL/min (by C-G formula based on SCr of 0.89 mg/dL).  Liver Function Tests: Recent Labs  Lab 12/25/17 1210 12/30/17 0055  AST 17 20  ALT 11 18  ALKPHOS 58 61  BILITOT 0.5 0.6  PROT 8.0 7.9  ALBUMIN 4.1 3.8   No results for input(s): LIPASE, AMYLASE in the last 168 hours. No results for input(s): AMMONIA in the last 168 hours. Coagulation Profile: No results for input(s): INR, PROTIME in the last 168 hours. Cardiac Enzymes: No results for input(s): CKTOTAL, CKMB, CKMBINDEX, TROPONINI in the last 168 hours. BNP (last 3 results) No results for input(s): PROBNP in the last 8760 hours. HbA1C: No results for input(s): HGBA1C in the last 72 hours. CBG: No results for input(s): GLUCAP in the last 168 hours. Lipid Profile: No results for input(s): CHOL, HDL, LDLCALC, TRIG, CHOLHDL, LDLDIRECT in the last 72 hours. Thyroid Function Tests: No results for input(s): TSH, T4TOTAL, FREET4, T3FREE, THYROIDAB in the last 72 hours. Anemia Panel: No results for input(s): VITAMINB12, FOLATE, FERRITIN, TIBC, IRON, RETICCTPCT in the last 72 hours. Sepsis  Labs: Lactic acid normal Invalid input(s): PROCALCITONIN, LACTICACIDVEN Recent Results (from the past 240 hour(s))  Aerobic/Anaerobic Culture (surgical/deep wound)     Status: None   Collection Time: 12/20/17  3:03 PM  Result Value Ref Range Status   Specimen Description ABSCESS ARM  Final   Special Requests Normal  Final   Gram Stain   Final    MODERATE WBC PRESENT,BOTH PMN AND MONONUCLEAR ABUNDANT GRAM POSITIVE COCCI FEW GRAM NEGATIVE RODS RARE GRAM POSITIVE RODS    Culture   Final    FEW ENTEROBACTER AEROGENES FEW STAPHYLOCOCCUS AUREUS FEW PREVOTELLA SPECIES BETA LACTAMASE POSITIVE Performed at [facility], [address] [place], [place] [address]    Report Status 12/24/2017 FINAL  Final   Organism ID, Bacteria ENTEROBACTER AEROGENES  Final   Organism ID, Bacteria STAPHYLOCOCCUS AUREUS  Final  Susceptibility   Enterobacter aerogenes - MIC*    CEFAZOLIN RESISTANT Resistant     CEFEPIME <=1 SENSITIVE Sensitive     CEFTAZIDIME <=1 SENSITIVE Sensitive     CEFTRIAXONE <=1 SENSITIVE Sensitive     CIPROFLOXACIN <=0.25 SENSITIVE Sensitive     GENTAMICIN <=1 SENSITIVE Sensitive     IMIPENEM 1 SENSITIVE Sensitive     TRIMETH/SULFA <=20 SENSITIVE Sensitive     PIP/TAZO <=4 SENSITIVE Sensitive     * FEW ENTEROBACTER AEROGENES   Staphylococcus aureus - MIC*    CIPROFLOXACIN 2 INTERMEDIATE Intermediate     ERYTHROMYCIN RESISTANT Resistant     GENTAMICIN <=0.5 SENSITIVE Sensitive     OXACILLIN 0.5 SENSITIVE Sensitive     TETRACYCLINE <=1 SENSITIVE Sensitive     VANCOMYCIN <=0.5 SENSITIVE Sensitive     TRIMETH/SULFA <=10 SENSITIVE Sensitive     CLINDAMYCIN RESISTANT Resistant     RIFAMPIN <=0.5 SENSITIVE Sensitive     Inducible Clindamycin POSITIVE Resistant     * FEW STAPHYLOCOCCUS AUREUS         Radiological Exams on Admission: Personally reviewed X-ray and [place] reportt: Dg Elbow Complete Left  Result Date: 12/30/2017 CLINICAL DATA:  Abscess to the left elbow.   IV drug user. EXAM: LEFT ELBOW - COMPLETE 3+ VIEW COMPARISON:  12/20/2017 FINDINGS: There is no evidence of fracture, dislocation, or joint effusion. There is no evidence of arthropathy or other focal bone abnormality. Soft tissues are unremarkable. IMPRESSION: Negative. Electronically Signed   By: Burman Nieves M.D.   On: 12/30/2017 02:16   [place] Lt Upper Extrem Ltd Soft Tissue Non Vascular  Result Date: 12/30/2017 CLINICAL DATA:  Left antecubital fossa abscess. History of IV drug abuse. EXAM: ULTRASOUND LEFT UPPER EXTREMITY LIMITED TECHNIQUE: Ultrasound examination of the upper extremity soft tissues was performed in the area of clinical concern. COMPARISON:  Left elbow x-rays from same day. FINDINGS: Focused ultrasound in the left antecubital fossa demonstrates a irregular, heterogeneously complex fluid collection measuring 6.2 x 3.3 x 4.5 cm. There is no internal vascularity. There is mild surrounding hyperemia. IMPRESSION: 1. 6.2 cm complex fluid collection in the antecubital fossa, most consistent with abscess given clinical history. Electronically Signed   By: Obie Dredge M.D.   On: 12/30/2017 10:08        Assessment/Plan  1. Cellulitis and Abscess of left AC:  Culture growing Enterobacter, MSSA and Prevotella.  Discussed theoretical inducible resistance to CTX in Enterobact, even if in vitro susc, but don't think this is likely at present, suspect with the abscess this is mostly Staph.   -Continue Ceftriaxone   -Stop vancomycin -Add Flagyl -Monitor fever curve -Consult Hand surgery, appreciate expert care    2. IVDU:  Patient has experience with outpatient SU treatment, has been on Suboxone in controlled setting before.  3. Hypokalemia:  Repleted -Repeat BMP tomorrow       DVT prophylaxis: Lovenox  Code Status: FULL  Family Communication: Partner sleeping in room  Disposition Plan: Anticipate IV antibiotics, consultation with Hand surgery.  Possible debridement,  defer to surgery.   Consults called: Hand Admission status: INPATIENT    The patient has a limb threatening infection of the left arm, that has failed two separate courses of oral outpatient thearpy.  He now requires broadening coverage, IV antibiotics and possibly surgery.  It is my expectation at the time of admission that he will need inpatient services for >2 midnights.      Medical decision making: Patient seen at  7:30 AM on 12/30/2017.  The patient was discussed with Dr. Janee Morn.  What exists of the patient's chart was reviewed in depth and summarized above.  Clinical condition: stable.        Alberteen Sam [facility] Pager [phone]

## 2017-12-30 NOTE — ED Triage Notes (Addendum)
Patient with left AC abscess.  Has been here twice recently with antibiotic changes but the abscess is getting worse.  He states that he lanced it earlier today and got a lot of pus out and then it was bleeding.  At this time there is serosanguinous drainage with odor.   He has had fevers and now area up to bicept is swollen and hot to touch.

## 2017-12-30 NOTE — ED Notes (Signed)
  Attempted to call report.  RN will call me back. 

## 2017-12-30 NOTE — Consult Note (Signed)
Reason for Consult: Left antecubital superficial abscess with concern for deep abscess in the setting of IV drug abuse. Referring Physician: Dr. Micheline Rough Luginbill is an 22 y.o. male.  HPI: Patient states 2 weeks ago he began noting some pain and discomfort in his left elbow after shooting up heroin.  He feels like he missed his vein and injected into this tissues.  He was seen at [facility] where there was concern for cellulitis.  He was initially placed on doxycycline.  He had some improvement in his symptoms however repeat return to the emergency department with worsened pain.  He was placed on Keflex.  Then the patient states couple days ago he noted a bullae on the antecubital fossa region that popped and began draining a large amount of purulent material and blood.  He represented to the emergency department.  Orthopedics was consulted for concern for underlying deep abscess of the left antecubital fossa.  Patient states he has significant amount of pain in his left elbow.  He is not able to move his elbow well.  He denies any fevers or chills or systemic symptoms.  He says the needles that he injects with are clean.  He denies any other joint or extremity pain today.  Past Medical History:  Diagnosis Date  . IV drug abuse ([place])   . IVDU (intravenous drug user)   . Staphylococcus aureus bacteremia    2016 at [facility]    Past Surgical History:  Procedure Laterality Date  . TONSILLECTOMY      Family History  Problem Relation Age of Onset  . Stroke Paternal Grandmother     Social History:  reports that he has been smoking cigarettes. He has a 2.50 pack-year smoking history. He has never used smokeless tobacco. He reports that he drinks alcohol. He reports that he has current or past drug history. Drug: IV.  Allergies:  Allergies  Allergen Reactions  . Sulfa Antibiotics Swelling and Anaphylaxis    Medications: I have reviewed the patient's current medications.  Results for  orders placed or performed during the hospital encounter of 12/30/17 (from the past 48 hour(s))  CBC with Differential     Status: None   Collection Time: 12/30/17 12:55 AM  Result Value Ref Range   WBC 10.3 4.0 - 10.5 K/uL   RBC 4.85 4.22 - 5.81 MIL/uL   Hemoglobin 13.0 13.0 - 17.0 g/dL   HCT 40.6 39.0 - 52.0 %   MCV 83.7 80.0 - 100.0 fL   MCH 26.8 26.0 - 34.0 pg   MCHC 32.0 30.0 - 36.0 g/dL   RDW 14.0 11.5 - 15.5 %   Platelets 385 150 - 400 K/uL   nRBC 0.0 0.0 - 0.2 %   Neutrophils Relative % 65 %   Neutro Abs 6.8 1.7 - 7.7 K/uL   Lymphocytes Relative 22 %   Lymphs Abs 2.2 0.7 - 4.0 K/uL   Monocytes Relative 10 %   Monocytes Absolute 1.0 0.1 - 1.0 K/uL   Eosinophils Relative 2 %   Eosinophils Absolute 0.2 0.0 - 0.5 K/uL   Basophils Relative 0 %   Basophils Absolute 0.0 0.0 - 0.1 K/uL   Immature Granulocytes 1 %   Abs Immature Granulocytes 0.06 0.00 - 0.07 K/uL    Comment: Performed at [facility], [address] [place], [place] [address]  Comprehensive metabolic panel     Status: Abnormal   Collection Time: 12/30/17 12:55 AM  Result Value  Ref Range   Sodium 139 135 - 145 mmol/L   Potassium 3.4 (L) 3.5 - 5.1 mmol/L   Chloride 105 98 - 111 mmol/L   CO2 23 22 - 32 mmol/L   Glucose, Bld 120 (H) 70 - 99 mg/dL   BUN 9 6 - 20 mg/dL   Creatinine, Ser 0.89 0.61 - 1.24 mg/dL   Calcium 9.0 8.9 - 10.3 mg/dL   Total Protein 7.9 6.5 - 8.1 g/dL   Albumin 3.8 3.5 - 5.0 g/dL   AST 20 15 - 41 U/L   ALT 18 0 - 44 U/L   Alkaline Phosphatase 61 38 - 126 U/L   Total Bilirubin 0.6 0.3 - 1.2 mg/dL   GFR calc non Af Amer >60 >60 mL/min   GFR calc Af Amer >60 >60 mL/min    Comment: (NOTE) The eGFR has been calculated using the CKD EPI equation. This calculation has not been validated in all clinical situations. eGFR's persistently <60 mL/min signify possible Chronic Kidney Disease.    Anion gap 11 5 - 15    Comment: Performed at [facility] [address],  [place], [place] [address]  I-Stat CG4 Lactic Acid, ED     Status: None   Collection Time: 12/30/17  1:12 AM  Result Value Ref Range   Lactic Acid, Venous 1.11 0.5 - 1.9 mmol/L  Urine rapid drug screen (hosp performed)     Status: Abnormal   Collection Time: 12/30/17  5:33 AM  Result Value Ref Range   Opiates POSITIVE (A) NONE DETECTED   Cocaine NONE DETECTED NONE DETECTED   Benzodiazepines NONE DETECTED NONE DETECTED   Amphetamines POSITIVE (A) NONE DETECTED   Tetrahydrocannabinol NONE DETECTED NONE DETECTED   Barbiturates NONE DETECTED NONE DETECTED    Comment: (NOTE) DRUG SCREEN FOR MEDICAL PURPOSES ONLY.  IF CONFIRMATION IS NEEDED FOR ANY PURPOSE, NOTIFY LAB WITHIN 5 DAYS. LOWEST DETECTABLE LIMITS FOR URINE DRUG SCREEN Drug Class                     Cutoff (ng/mL) Amphetamine and metabolites    1000 Barbiturate and metabolites    200 Benzodiazepine                 119 Tricyclics and metabolites     300 Opiates and metabolites        300 Cocaine and metabolites        300 THC                            50 Performed at [facility], [place] [address], [place], [place] [address]     Dg Elbow Complete Left  Result Date: 12/30/2017 CLINICAL DATA:  Abscess to the left elbow.  IV drug user. EXAM: LEFT ELBOW - COMPLETE 3+ VIEW COMPARISON:  12/20/2017 FINDINGS: There is no evidence of fracture, dislocation, or joint effusion. There is no evidence of arthropathy or other focal bone abnormality. Soft tissues are unremarkable. IMPRESSION: Negative. Electronically Signed   By: Lucienne Capers M.D.   On: 12/30/2017 02:16   [place] Lt Upper Extrem Ltd Soft Tissue Non Vascular  Result Date: 12/30/2017 CLINICAL DATA:  Left antecubital fossa abscess. History of IV drug abuse. EXAM: ULTRASOUND LEFT UPPER EXTREMITY LIMITED TECHNIQUE: Ultrasound examination of the upper extremity soft tissues was performed in the area of clinical concern. COMPARISON:  Left elbow x-rays from same day. FINDINGS:  Focused  ultrasound in the left antecubital fossa demonstrates a irregular, heterogeneously complex fluid collection measuring 6.2 x 3.3 x 4.5 cm. There is no internal vascularity. There is mild surrounding hyperemia. IMPRESSION: 1. 6.2 cm complex fluid collection in the antecubital fossa, most consistent with abscess given clinical history. Electronically Signed   By: Titus Dubin M.D.   On: 12/30/2017 10:08    Review of Systems  Constitutional: Negative.   HENT: Negative.   Respiratory: Negative.   Cardiovascular: Negative.   Gastrointestinal: Negative.   Musculoskeletal:       Left elbow pain.  Open wound draining purulence.  Swelling and redness about the elbow.  Skin:       Pain and swelling about the left elbow skin.  There is a small open wound on the medial aspect of the antecubital fossa.  Neurological: Negative.   Psychiatric/Behavioral: Negative.    Blood pressure (!) 111/55, pulse (!) 59, temperature 98.1 F (36.7 C), temperature source Oral, resp. rate (!) 26, SpO2 100 %. Physical Exam  Constitutional: He appears well-developed.  HENT:  Head: Normocephalic.  Eyes: Conjunctivae are normal.  Neck: Normal range of motion.  Cardiovascular: Normal rate.  Respiratory: Effort normal.  GI: Soft.  Musculoskeletal:  Patient has a small open wound on the medial aspect of his left antecubital fossa.  Is draining purulence.  There is surrounding induration and erythema.  The swelling appears to have come down some given that there is wrinkles.  He has pain with attempted elbow motion.  He has pain with attempted forearm pronation supination.  He has no pain to palpation more proximally up the arm or distally about the forearm.  He is able to range his shoulder without difficulty.  Range his wrist without difficulty.  He has motor intact with median, ulnar and radial nerves.  Sensation intact median, ulnar, radial nerves.  Palpable radial pulse.  No evidence of injury of or pain to  right upper extremity or bilateral lower extremity's.    Assessment/Plan: Left antecubital fossa cellulitis and superficial abscess in the setting of IV drug abuse.  Concern for deep abscess versus septic arthritis.  Domanick has a difficult problem.  His abscess is draining which is encouraging.  Would encourage continued expression of the wound.  We will order an MRI scan to look for underlying deep abscess prior to any surgical intervention.  If only a superficial abscess is seen a bedside I&D may be appropriate.  We will also assess for increased elbow effusion.  Would recommend continuing a more broad-spectrum antibiotic coverage and will defer to the medicine team for this.  Thelonious certainly is not septic from this.  He is afebrile and has no systemic symptoms.  His white count is not elevated.  We will make the patient n.p.o. past midnight in case surgical debridement is needed in the morning.  Will update my treatment plan after the MRI results.  Erle Crocker 12/30/2017, 5:21 PM

## 2017-12-30 NOTE — Plan of Care (Signed)
  Problem: Education: Goal: Knowledge of General Education information will improve Description: Including pain rating scale, medication(s)/side effects and non-pharmacologic comfort measures Outcome: Progressing   Problem: Clinical Measurements: Goal: Ability to maintain clinical measurements within normal limits will improve Outcome: Progressing   Problem: Pain Managment: Goal: General experience of comfort will improve Outcome: Progressing   Problem: Skin Integrity: Goal: Risk for impaired skin integrity will decrease Outcome: Progressing   

## 2017-12-30 NOTE — ED Notes (Signed)
Patient transported to X-ray 

## 2017-12-30 NOTE — ED Provider Notes (Signed)
[facility] EMERGENCY DEPARTMENT Provider Note   CSN: [id number] Arrival date & time: 12/30/17  0017     History   Chief Complaint Chief Complaint  Patient presents with  . Abscess    HPI Elijah Hall is a 22 y.o. male.  Patient presents to the emergency department with a chief complaint of cellulitis and abscess.  He states that he has been seen twice recently for a left antecubital abscess secondary to IV heroin use.  Patient denies any fevers or chills.  He denies nausea or vomiting.  He states that he did have the abscess drained about a week ago.  It has continued to drain.  Today it opened spontaneously and drained copious purulent fluid.  He has been taking his doxycycline and Keflex as prescribed.  He reports gradually worsening symptoms.  He complains of pain with extension, but is able to flex his elbow.  He denies any other associated symptoms.  The history is provided by the patient. No language interpreter was used.    Past Medical History:  Diagnosis Date  . IV drug abuse ([facility])     There are no active problems to display for this patient.   Past Surgical History:  Procedure Laterality Date  . TONSILLECTOMY          Home Medications    Prior to Admission medications   Medication Sig Start Date End Date Taking? Authorizing Provider  cephALEXin (KEFLEX) 500 MG capsule Take 1 capsule (500 mg total) by mouth 4 (four) times daily. 12/25/17   Melene Plan, DO  clindamycin (CLEOCIN) 300 MG capsule Take 1 capsule (300 mg total) by mouth 3 (three) times daily. 05/11/15   Rebecka Apley, MD  doxycycline (VIBRAMYCIN) 100 MG capsule Take 1 capsule (100 mg total) by mouth 2 (two) times daily. 12/20/17   Hedges, Tinnie Gens, PA-C  traMADol (ULTRAM) 50 MG tablet Take 1 tablet (50 mg total) by mouth every 6 (six) hours as needed. 05/11/15   Rebecka Apley, MD    Family History No family history on file.  Social History Social History   Tobacco Use    . Smoking status: Current Some Day Smoker    Packs/day: 0.50    Years: 5.00    Pack years: 2.50    Types: Cigarettes  . Smokeless tobacco: Never Used  Substance Use Topics  . Alcohol use: Yes    Comment: rarely  . Drug use: Yes    Types: IV    Comment: Heroin Addiction      Allergies   Sulfa antibiotics   Review of Systems Review of Systems  All other systems reviewed and are negative.    Physical Exam Updated Vital Signs BP 100/65 (BP Location: Right Arm)   Pulse 94   Temp 98.5 F (36.9 C) (Oral)   Resp (!) 22   SpO2 100%   Physical Exam  Constitutional: He is oriented to person, place, and time. He appears well-developed and well-nourished.  HENT:  Head: Normocephalic and atraumatic.  Eyes: Pupils are equal, round, and reactive to light. Conjunctivae and EOM are normal. Right eye exhibits no discharge. Left eye exhibits no discharge. No scleral icterus.  Neck: Normal range of motion. Neck supple. No JVD present.  Cardiovascular: Normal rate, regular rhythm and normal heart sounds. Exam reveals no gallop and no friction rub.  No murmur heard. Pulmonary/Chest: Effort normal and breath sounds normal. No respiratory distress. He has no wheezes. He has no rales. He  exhibits no tenderness.  Abdominal: Soft. He exhibits no distension and no mass. There is no tenderness. There is no rebound and no guarding.  Musculoskeletal: Normal range of motion. He exhibits no edema or tenderness.  Neurological: He is alert and oriented to person, place, and time.  Skin: Skin is warm and dry.  Left-sided antecubital erythema/cellulitis with small 0.25 cm draining wound as pictured below  Psychiatric: He has a normal mood and affect. His behavior is normal. Judgment and thought content normal.  Nursing note and vitals reviewed.      ED Treatments / Results  Labs (all labs ordered are listed, but only abnormal results are displayed) Labs Reviewed  COMPREHENSIVE METABOLIC PANEL  - Abnormal; Notable for the following components:      Result Value   Potassium 3.4 (*)    Glucose, Bld 120 (*)    All other components within normal limits  CULTURE, BLOOD (ROUTINE X 2)  CULTURE, BLOOD (ROUTINE X 2)  CBC WITH DIFFERENTIAL/PLATELET  RAPID URINE DRUG SCREEN, HOSP PERFORMED  I-STAT CG4 LACTIC ACID, ED    EKG None  Radiology Dg Elbow Complete Left  Result Date: 12/30/2017 CLINICAL DATA:  Abscess to the left elbow.  IV drug user. EXAM: LEFT ELBOW - COMPLETE 3+ VIEW COMPARISON:  12/20/2017 FINDINGS: There is no evidence of fracture, dislocation, or joint effusion. There is no evidence of arthropathy or other focal bone abnormality. Soft tissues are unremarkable. IMPRESSION: Negative. Electronically Signed   By: Burman Nieves M.D.   On: 12/30/2017 02:16    Procedures Procedures (including critical care time)  Medications Ordered in ED Medications  HYDROmorphone (DILAUDID) injection 1 mg (has no administration in time range)  cefTRIAXone (ROCEPHIN) 1 g in sodium chloride 0.9 % 100 mL IVPB (has no administration in time range)     Initial Impression / Assessment and Plan / ED Course  I have reviewed the triage vital signs and the nursing notes.  Pertinent labs & imaging results that were available during my care of the patient were reviewed by me and considered in my medical decision making (see chart for details).     Patient with cellulitis and discharge from left antecubital fossa.  Patient is IV heroin user.  He has been seen twice recently and has been taking doxycycline and Keflex for this.  Initially had incision and drainage performed in ED about 10 days ago.  He still has some mild discharge from the wound, which is mostly serosanguineous now, but was purulent earlier today.  I am unable to express any real significant amount of pus at this time.  Bedside ultrasound was somewhat limited by pain, but there is no clear fluid collection.  He is able to range  his elbow and full flexion, there is no fluid collection seen in the joint space on x-ray, doubt septic arthritis.  At this time I believe most of those symptoms to be cellulitic, and will need IV treatment as he has failed outpatient oral antibiotic therapy.  Appreciate Dr. Katrinka Blazing from the hospitalist service for admitting the patient.  Final Clinical Impressions(s) / ED Diagnoses   Final diagnoses:  Cellulitis of left upper extremity    ED Discharge Orders    None       Roxy Horseman, PA-C 12/30/17 0514    Gilda Crease, MD 12/30/17 [phone]

## 2017-12-30 NOTE — ED Notes (Signed)
ED Provider at bedside. 

## 2017-12-30 NOTE — Progress Notes (Signed)
Received pt alert and oriented. Ambulated to bed. Called admitting for orders. Oriented pt with room.

## 2017-12-30 NOTE — Plan of Care (Signed)
Received signout from Roxy Horseman, PA-C   Mr. Elijah Hall is a 22 year old male pmh of heroin abuse; who presents with cellulitis and abscess of the left antecubital fossa secondary to IV heroin abuse.  Previous cultures obtained from 10/2, grew Enterobacter and staph aureus.  Blood cultures were obtained and patient was placed on IV vancomycin and Rocephin.  [facility] called to admit.  Accepted to a MedSurg bed as inpatient.  Soft tissue ultrasound ordered.  Will likely need to consult surgery.

## 2017-12-30 NOTE — Progress Notes (Signed)
Pharmacy Antibiotic Note  Elijah Hall is a 22 y.o. male admitted on 12/30/2017 with cellulitis.  Pharmacy has been consulted for Vancomycin dosing. Not improving with PO anti-biotics. WBC WNL. Renal function good. Recent cultures with MSSA/enterobacter/prevotella.   Plan: Vancomycin 1000 mg IV q8h Ceftriaxone 1g IV x 1 in the ED, f/u additional coverage Trend WBC, temp, renal function  F/U infectious work-up Drug levels as indicated  Temp (24hrs), Avg:98.5 F (36.9 C), Min:98.5 F (36.9 C), Max:98.5 F (36.9 C)  Recent Labs  Lab 12/25/17 1210 12/25/17 1224 12/30/17 0055 12/30/17 0112  WBC 7.8  --  10.3  --   CREATININE 0.92  --   --   --   LATICACIDVEN  --  1.58  --  1.11    Estimated Creatinine Clearance: 123.1 mL/min (by C-G formula based on SCr of 0.92 mg/dL).    Allergies  Allergen Reactions  . Sulfa Antibiotics Swelling and Anaphylaxis    Abran Duke 12/30/2017 1:33 AM

## 2017-12-31 ENCOUNTER — Inpatient Hospital Stay (HOSPITAL_COMMUNITY): Payer: Federal, State, Local not specified - PPO | Admitting: Certified Registered Nurse Anesthetist

## 2017-12-31 ENCOUNTER — Encounter (HOSPITAL_COMMUNITY)
Admission: EM | Disposition: A | Discharge status: Home / Self Care | Payer: Self-pay | Source: Home / Self Care | Attending: Family Medicine

## 2017-12-31 HISTORY — PX: I&D EXTREMITY: SHX5045

## 2017-12-31 LAB — CBC
HEMATOCRIT: 39.6 % (ref 39.0–52.0)
HEMOGLOBIN: 12.6 g/dL — AB (ref 13.0–17.0)
MCH: 26.9 pg (ref 26.0–34.0)
MCHC: 31.8 g/dL (ref 30.0–36.0)
MCV: 84.6 fL (ref 80.0–100.0)
Platelets: 411 10*3/uL — ABNORMAL HIGH (ref 150–400)
RBC: 4.68 MIL/uL (ref 4.22–5.81)
RDW: 14.2 % (ref 11.5–15.5)
WBC: 6.7 10*3/uL (ref 4.0–10.5)
nRBC: 0 % (ref 0.0–0.2)

## 2017-12-31 LAB — HIV ANTIBODY (ROUTINE TESTING W REFLEX): HIV Screen 4th Generation wRfx: NONREACTIVE

## 2017-12-31 LAB — BASIC METABOLIC PANEL
ANION GAP: 8 (ref 5–15)
BUN: 9 mg/dL (ref 6–20)
CO2: 26 mmol/L (ref 22–32)
Calcium: 8.9 mg/dL (ref 8.9–10.3)
Chloride: 108 mmol/L (ref 98–111)
Creatinine, Ser: 0.87 mg/dL (ref 0.61–1.24)
Glucose, Bld: 114 mg/dL — ABNORMAL HIGH (ref 70–99)
POTASSIUM: 3.9 mmol/L (ref 3.5–5.1)
SODIUM: 142 mmol/L (ref 135–145)

## 2017-12-31 LAB — SURGICAL PCR SCREEN
MRSA, PCR: NEGATIVE
STAPHYLOCOCCUS AUREUS: POSITIVE — AB

## 2017-12-31 SURGERY — IRRIGATION AND DEBRIDEMENT EXTREMITY
Anesthesia: General | Site: Arm Lower | Laterality: Left

## 2017-12-31 MED ORDER — OXYCODONE HCL 5 MG PO TABS
5.0000 mg | ORAL_TABLET | Freq: Once | ORAL | Status: AC | PRN
  Administered 2017-12-31: 5 mg via ORAL

## 2017-12-31 MED ORDER — CEFAZOLIN SODIUM 1 G IJ SOLR
INTRAMUSCULAR | Status: DC | PRN
  Administered 2017-12-31: 2 g via INTRAMUSCULAR

## 2017-12-31 MED ORDER — HYDROMORPHONE HCL 1 MG/ML IJ SOLN
0.2500 mg | INTRAMUSCULAR | Status: DC | PRN
  Administered 2017-12-31 (×4): 0.5 mg via INTRAVENOUS

## 2017-12-31 MED ORDER — MIDAZOLAM HCL 5 MG/5ML IJ SOLN
INTRAMUSCULAR | Status: DC | PRN
  Administered 2017-12-31: 2 mg via INTRAVENOUS

## 2017-12-31 MED ORDER — SODIUM CHLORIDE 0.9 % IR SOLN
Status: DC | PRN
  Administered 2017-12-31: 1000 mL

## 2017-12-31 MED ORDER — DEXAMETHASONE SODIUM PHOSPHATE 10 MG/ML IJ SOLN
INTRAMUSCULAR | Status: AC
  Filled 2017-12-31: qty 1

## 2017-12-31 MED ORDER — OXYCODONE HCL 5 MG/5ML PO SOLN: 5.0000 mg | Freq: Once | ORAL | Status: AC | PRN

## 2017-12-31 MED ORDER — OXYCODONE HCL 5 MG PO TABS
ORAL_TABLET | ORAL | Status: AC
  Administered 2017-12-31: 5 mg via ORAL
  Filled 2017-12-31: qty 1

## 2017-12-31 MED ORDER — MUPIROCIN 2 % EX OINT
1.0000 "application " | TOPICAL_OINTMENT | Freq: Two times a day (BID) | CUTANEOUS | Status: DC
  Administered 2017-12-31 – 2018-01-02 (×5): 1 via NASAL
  Filled 2017-12-31 (×2): qty 22

## 2017-12-31 MED ORDER — PROPOFOL 10 MG/ML IV BOLUS
INTRAVENOUS | Status: AC
  Filled 2017-12-31: qty 20

## 2017-12-31 MED ORDER — CHLORHEXIDINE GLUCONATE CLOTH 2 % EX PADS
6.0000 | MEDICATED_PAD | Freq: Every day | CUTANEOUS | Status: DC
  Administered 2017-12-31 – 2018-01-02 (×3): 6 via TOPICAL

## 2017-12-31 MED ORDER — DEXAMETHASONE SODIUM PHOSPHATE 10 MG/ML IJ SOLN
INTRAMUSCULAR | Status: DC | PRN
  Administered 2017-12-31: 5 mg via INTRAVENOUS

## 2017-12-31 MED ORDER — MIDAZOLAM HCL 2 MG/2ML IJ SOLN
INTRAMUSCULAR | Status: AC
  Filled 2017-12-31: qty 2

## 2017-12-31 MED ORDER — LIDOCAINE 2% (20 MG/ML) 5 ML SYRINGE
INTRAMUSCULAR | Status: AC
  Filled 2017-12-31: qty 5

## 2017-12-31 MED ORDER — PROPOFOL 10 MG/ML IV BOLUS
INTRAVENOUS | Status: DC | PRN
  Administered 2017-12-31: 200 mg via INTRAVENOUS

## 2017-12-31 MED ORDER — HYDROMORPHONE HCL 1 MG/ML IJ SOLN
INTRAMUSCULAR | Status: AC
  Administered 2017-12-31: 0.5 mg via INTRAVENOUS
  Filled 2017-12-31: qty 2

## 2017-12-31 MED ORDER — LACTATED RINGERS IV SOLN
INTRAVENOUS | Status: DC | PRN
  Administered 2017-12-31 (×2): via INTRAVENOUS

## 2017-12-31 MED ORDER — OXYCODONE HCL 5 MG PO TABS
10.0000 mg | ORAL_TABLET | ORAL | Status: DC | PRN
  Administered 2017-12-31: 10 mg via ORAL
  Administered 2017-12-31: 5 mg via ORAL
  Administered 2017-12-31 – 2018-01-01 (×3): 10 mg via ORAL
  Filled 2017-12-31 (×5): qty 2

## 2017-12-31 MED ORDER — LIDOCAINE HCL (CARDIAC) PF 100 MG/5ML IV SOSY
PREFILLED_SYRINGE | INTRAVENOUS | Status: DC | PRN
  Administered 2017-12-31: 80 mg via INTRAVENOUS

## 2017-12-31 MED ORDER — FENTANYL CITRATE (PF) 100 MCG/2ML IJ SOLN
INTRAMUSCULAR | Status: DC | PRN
  Administered 2017-12-31: 25 ug via INTRAVENOUS
  Administered 2017-12-31 (×2): 50 ug via INTRAVENOUS
  Administered 2017-12-31: 25 ug via INTRAVENOUS
  Administered 2017-12-31: 50 ug via INTRAVENOUS
  Administered 2017-12-31 (×2): 25 ug via INTRAVENOUS

## 2017-12-31 MED ORDER — FENTANYL CITRATE (PF) 250 MCG/5ML IJ SOLN
INTRAMUSCULAR | Status: AC
  Filled 2017-12-31: qty 5

## 2017-12-31 MED ORDER — ONDANSETRON HCL 4 MG/2ML IJ SOLN
4.0000 mg | Freq: Four times a day (QID) | INTRAMUSCULAR | Status: AC | PRN
  Administered 2017-12-31: 4 mg via INTRAVENOUS

## 2017-12-31 MED ORDER — KETOROLAC TROMETHAMINE 30 MG/ML IJ SOLN
INTRAMUSCULAR | Status: AC
  Filled 2017-12-31: qty 1

## 2017-12-31 SURGICAL SUPPLY — 43 items
BANDAGE ACE 4X5 VEL STRL LF (GAUZE/BANDAGES/DRESSINGS) IMPLANT
BANDAGE ACE 6X5 VEL STRL LF (GAUZE/BANDAGES/DRESSINGS) IMPLANT
BLADE SURG 10 STRL SS (BLADE) ×3 IMPLANT
BNDG COHESIVE 4X5 TAN STRL (GAUZE/BANDAGES/DRESSINGS) IMPLANT
BNDG GAUZE ELAST 4 BULKY (GAUZE/BANDAGES/DRESSINGS) ×3 IMPLANT
CHLORAPREP W/TINT 26ML (MISCELLANEOUS) ×3 IMPLANT
CONT SPECI 4OZ STER CLIK (MISCELLANEOUS) ×3 IMPLANT
COVER SURGICAL LIGHT HANDLE (MISCELLANEOUS) ×3 IMPLANT
COVER WAND RF STERILE (DRAPES) ×3 IMPLANT
CUFF TOURNIQUET SINGLE 34IN LL (TOURNIQUET CUFF) IMPLANT
CUFF TOURNIQUET SINGLE 44IN (TOURNIQUET CUFF) IMPLANT
DURAPREP 26ML APPLICATOR (WOUND CARE) IMPLANT
ELECT REM PT RETURN 9FT ADLT (ELECTROSURGICAL)
ELECTRODE REM PT RTRN 9FT ADLT (ELECTROSURGICAL) IMPLANT
EVACUATOR 1/8 PVC DRAIN (DRAIN) IMPLANT
FACESHIELD WRAPAROUND (MASK) IMPLANT
GAUZE SPONGE 4X4 12PLY STRL (GAUZE/BANDAGES/DRESSINGS) ×3 IMPLANT
GAUZE XEROFORM 1X8 LF (GAUZE/BANDAGES/DRESSINGS) IMPLANT
GLOVE BIO SURGEON STRL SZ8 (GLOVE) ×9 IMPLANT
GOWN STRL REUS W/ TWL LRG LVL3 (GOWN DISPOSABLE) ×1 IMPLANT
GOWN STRL REUS W/TWL 2XL LVL3 (GOWN DISPOSABLE) ×3 IMPLANT
GOWN STRL REUS W/TWL LRG LVL3 (GOWN DISPOSABLE) ×2
HANDPIECE INTERPULSE COAX TIP (DISPOSABLE)
KIT BASIN OR (CUSTOM PROCEDURE TRAY) ×3 IMPLANT
KIT TURNOVER KIT B (KITS) ×3 IMPLANT
MANIFOLD NEPTUNE II (INSTRUMENTS) ×3 IMPLANT
NS IRRIG 1000ML POUR BTL (IV SOLUTION) ×3 IMPLANT
PACK ORTHO EXTREMITY (CUSTOM PROCEDURE TRAY) ×3 IMPLANT
PAD ABD 8X10 STRL (GAUZE/BANDAGES/DRESSINGS) ×3 IMPLANT
PAD ARMBOARD 7.5X6 YLW CONV (MISCELLANEOUS) ×6 IMPLANT
PENCIL BUTTON HOLSTER BLD 10FT (ELECTRODE) IMPLANT
SET HNDPC FAN SPRY TIP SCT (DISPOSABLE) IMPLANT
SPONGE LAP 18X18 X RAY DECT (DISPOSABLE) ×3 IMPLANT
STOCKINETTE IMPERVIOUS 9X36 MD (GAUZE/BANDAGES/DRESSINGS) IMPLANT
SUT ETHILON 3 0 PS 1 (SUTURE) IMPLANT
SWAB CULTURE ESWAB REG 1ML (MISCELLANEOUS) ×6 IMPLANT
TOWEL OR 17X24 6PK STRL BLUE (TOWEL DISPOSABLE) ×3 IMPLANT
TOWEL OR 17X26 10 PK STRL BLUE (TOWEL DISPOSABLE) ×3 IMPLANT
TUBE CONNECTING 12'X1/4 (SUCTIONS) ×1
TUBE CONNECTING 12X1/4 (SUCTIONS) ×2 IMPLANT
UNDERPAD 30X30 (UNDERPADS AND DIAPERS) ×3 IMPLANT
WATER STERILE IRR 1000ML POUR (IV SOLUTION) ×3 IMPLANT
YANKAUER SUCT BULB TIP NO VENT (SUCTIONS) ×3 IMPLANT

## 2017-12-31 NOTE — Anesthesia Postprocedure Evaluation (Signed)
Anesthesia Post Note  Patient: Elijah Hall  Procedure(s) Performed: IRRIGATION AND DEBRIDEMENT ELBOW ABSCESS (Left Arm Lower)     Patient location during evaluation: PACU Anesthesia Type: General Level of consciousness: awake and alert Pain management: pain level controlled Vital Signs Assessment: post-procedure vital signs reviewed and stable Respiratory status: spontaneous breathing, nonlabored ventilation, respiratory function stable and patient connected to nasal cannula oxygen Cardiovascular status: blood pressure returned to baseline and stable Postop Assessment: no apparent nausea or vomiting Anesthetic complications: no    Last Vitals:  Vitals:   12/31/17 1040 12/31/17 1353  BP: 123/70 129/75  Pulse: 61 69  Resp: 18 18  Temp: 36.7 C 36.5 C  SpO2: 99% 100%    Last Pain:  Vitals:   12/31/17 1641  TempSrc:   PainSc: Asleep                 Yulonda Wheeling S

## 2017-12-31 NOTE — Op Note (Signed)
Elijah Hall male 22 y.o. 12/31/2017  PreOperative Diagnosis: Left medial antecubital fossa abscess  PostOperative Diagnosis: Same   Procedure(s) and Anesthesia Type:    *Incision and drainage of left ELBOW ABSCESS - General  Surgeon: Terance Hart   Assistants: None  Anesthesia: General LMA anesthesia   Findings: Left medial antecubital fossa abscess with large cavity filled with necrotic fat and tissue.  Implants: None  Indications:22 y.o. male IV drug abuser developed pain in his left antecubital fossa about 2 weeks ago.  He was seen in outside facility and placed on doxycycline.  He had mild improvement in symptoms however they progressed and he represented the emergency department.  He was then placed on Keflex.  This did not alleviate his symptoms and he represented to [facility], ER.  He was admitted to the medical team with concern for left antecubital abscess and surrounding cellulitis.  Orthopedics was consulted.  On my evaluation he had significant amount of induration and erythema about the medial aspect of his antecubital fossa.  There is a small draining sinus tract draining purulent material.  MRI was obtained which showed fluid collection within the deep tissues.  There is also surrounding myositis.  After discussion with the patient of the risks, benefits and alternatives of surgery he opted to proceed.  Risks included but were not limited to wound healing complications, continued infection, damage to surrounding structures.  After weighing these risks he wishes to proceed.  Procedure Detail: Patient was seen in the preoperative holding area where the left upper extremity was marked by myself.  The consent was signed myself and the patient.  The patient was taken to the operative suite and placed supine on the operative table.  The left upper extremity was prepped and draped in usual sterile fashion.  2 g of ancef was given.  Surgical timeout was performed  identifying the left upper extremity in the appropriate surgical procedure.  Then the surgery was began by extending the draining sinus tract by 2 cm approximately 2 cm distally.  This was taken sharply down through skin and subcutaneous tissue.  Then blunt dissection was used to enter into the abscess cavity.  There is a small rush of purulent material.  I was able to bluntly dissect into the cavity and using a curette and rondure remove necrotic tissue that was within the deep abscess cavity.  This was all sent for culture.  Swabs were also taken of the deep cavity.  Then 3 L of normal saline was irrigated through the cavity and this area was expressed and milked for any other areas of fluctuance and there were none found.  1/4 inch Penrose drain was placed and sewn into place.  Then 2-0 nylon stitch was used to close the wound around the drain.  Soft dressing was placed.  Counts were correct at the end of the case.  There were no complications.  Post Op Instructions: He will be readmitted to the medicine team. Continue antibiotic treatment per medical team.  Follow-up culture results. Maintain the drain until it ceases drainage.  The drain is sutured in. He will follow-up with me in 2 weeks for wound check and suture removal.  Tourniquette Time:none  Estimated Blood Loss:  less than 50 mL         Drains: none  Blood Given: none         Specimens: none       Complications:  * No complications entered in OR log *  Disposition: PACU - hemodynamically stable.         Condition: stable

## 2017-12-31 NOTE — Progress Notes (Signed)
Report given to staff in short stay

## 2017-12-31 NOTE — Anesthesia Preprocedure Evaluation (Signed)
Anesthesia Evaluation  Patient identified by MRN, date of birth, ID band Patient awake    Reviewed: Allergy & Precautions, H&P , NPO status , Patient's Chart, lab work & pertinent test results  Airway Mallampati: II   Neck ROM: full    Dental   Pulmonary Current Smoker,    breath sounds clear to auscultation       Cardiovascular negative cardio ROS   Rhythm:regular Rate:Normal     Neuro/Psych    GI/Hepatic (+)     substance abuse  IV drug use,   Endo/Other    Renal/GU      Musculoskeletal   Abdominal   Peds  Hematology   Anesthesia Other Findings   Reproductive/Obstetrics                             Anesthesia Physical Anesthesia Plan  ASA: II  Anesthesia Plan: General   Post-op Pain Management:    Induction: Intravenous  PONV Risk Score and Plan: 1 and Ondansetron, Dexamethasone, Midazolam and Treatment may vary due to age or medical condition  Airway Management Planned: LMA  Additional Equipment:   Intra-op Plan:   Post-operative Plan:   Informed Consent: I have reviewed the patients History and Physical, chart, labs and discussed the procedure including the risks, benefits and alternatives for the proposed anesthesia with the patient or authorized representative who has indicated his/her understanding and acceptance.     Plan Discussed with: CRNA, Anesthesiologist and Surgeon  Anesthesia Plan Comments:         Anesthesia Quick Evaluation

## 2017-12-31 NOTE — Progress Notes (Signed)
PROGRESS NOTE    Elijah Hall  ZOX:[id number] DOB: [birth year] DOA: 12/30/2017 PCP: Patient, No Pcp Per      Brief Narrative:  Mr. Brackins is a 22 y.o. M with hx IVDU, remote MSSA bacteremia who presented with left AC infection and abscess failed outpatient therapy.  US showed fluid collection, confirmed on MRI.  To OR 10/13 for I&D.   Assessment & Plan:  Cellulitis and abscess of the left antecubital fossa -Continue ceftriaxone, Flagyl -Acetaminophen, oxycodone 10, ketorolac for pain control -Consult hand surgery, appreciate expert care -Follow intraoperative cultures, blood cultures   IVDU Consult social work  Hypokalemia Resolved     DVT prophylaxis: Lovenox Code Status: FULL Family Communication: None present MDM and disposition Plan: The below labs and imaging reports were reviewed and summarized above.  Medication management as above.  The patient was admitted with cellulitis and abscess related to abscess.  Will continue IV antibiotics monitor clinical improvement.   Consultants:   Orthopecis  Procedures:   I&D 10/13  Antimicrobials:   Vancomycin x1 10/13  Ceftriaxone 10/12 >>  Flagyl 10/12 >>   Culture data:   10/2 I&D culture: MSSA, Enterobacter, Prevotella  10/12 blood culture 2/2: NGTD  10/13 Intraop culture: NGTD      Subjective: A lot of pain post op.  No trouble moving fingers.  No fever, confusion.    Objective: Vitals:   12/31/17 1008 12/31/17 1016 12/31/17 1018 12/31/17 1040  BP:    123/70  Pulse: (!) 54 (!) 55 (!) 59 61  Resp: 15 12 15 18   Temp:  (!) 97.3 F (36.3 C)  98.1 F (36.7 C)  TempSrc:    Oral  SpO2: 100% 100% 100% 99%    Intake/Output Summary (Last 24 hours) at 12/31/2017 1144 Last data filed at 12/31/2017 1000 Gross per 24 hour  Intake 2159.1 ml  Output 20 ml  Net 2139.1 ml   There were no vitals filed for this visit.  Examination: General appearance: thin adult male, alert and in no acute distress.   Moderate discomfort from pain HEENT: Anicteric, conjunctiva pink, lids and lashes normal. No nasal deformity, discharge, epistaxis.  Lips moist, OP normal, hearing normal.   Skin: Warm and dry.  no jaundice.  No suspicious rashes or lesions. Cardiac: RRR, nl S1-S2, no murmurs appreciated.  Capillary refill is brisk.  JVP normal.  No LE edema.  Radia  pulses 2+ and symmetric. Respiratory: Normal respiratory rate and rhythm.  CTAB without rales or wheezes. Abdomen: Abdomen soft.  no TTP. No ascites, distension, hepatosplenomegaly.   MSK: No deformities or effusions. Neuro: Awake and alert.  EOMI, moves all extremities. Speech fluent.    Psych: Sensorium intact and responding to questions, attention normal. Affect blunted.  Judgment and insight appear normal.    Data Reviewed: I have personally reviewed following labs and imaging studies:  CBC: Recent Labs  Lab 12/25/17 1210 12/30/17 0055 12/31/17 0148  WBC 7.8 10.3 6.7  NEUTROABS 4.9 6.8  --   HGB 14.8 13.0 12.6*  HCT 45.0 40.6 39.6  MCV 85.2 83.7 84.6  PLT 330 385 411*   Basic Metabolic Panel: Recent Labs  Lab 12/25/17 1210 12/30/17 0055 12/31/17 0148  NA 136 139 142  K 4.1 3.4* 3.9  CL 104 105 108  CO2 24 23 26   GLUCOSE 117* 120* 114*  BUN 10 9 9   CREATININE 0.92 0.89 0.87  CALCIUM 9.6 9.0 8.9   GFR: Estimated Creatinine Clearance: 130.2 mL/min (by C-G  formula based on SCr of 0.87 mg/dL). Liver Function Tests: Recent Labs  Lab 12/25/17 1210 12/30/17 0055  AST 17 20  ALT 11 18  ALKPHOS 58 61  BILITOT 0.5 0.6  PROT 8.0 7.9  ALBUMIN 4.1 3.8   No results for input(s): LIPASE, AMYLASE in the last 168 hours. No results for input(s): AMMONIA in the last 168 hours. Coagulation Profile: No results for input(s): INR, PROTIME in the last 168 hours. Cardiac Enzymes: No results for input(s): CKTOTAL, CKMB, CKMBINDEX, TROPONINI in the last 168 hours. BNP (last 3 results) No results for input(s): PROBNP in the last  8760 hours. HbA1C: No results for input(s): HGBA1C in the last 72 hours. CBG: No results for input(s): GLUCAP in the last 168 hours. Lipid Profile: No results for input(s): CHOL, HDL, LDLCALC, TRIG, CHOLHDL, LDLDIRECT in the last 72 hours. Thyroid Function Tests: No results for input(s): TSH, T4TOTAL, FREET4, T3FREE, THYROIDAB in the last 72 hours. Anemia Panel: No results for input(s): VITAMINB12, FOLATE, FERRITIN, TIBC, IRON, RETICCTPCT in the last 72 hours. Urine analysis: No results found for: COLORURINE, APPEARANCEUR, LABSPEC, PHURINE, GLUCOSEU, HGBUR, BILIRUBINUR, KETONESUR, PROTEINUR, UROBILINOGEN, NITRITE, LEUKOCYTESUR Sepsis Labs: @LABRCNTIP (procalcitonin:4,lacticacidven:4)  ) Recent Results (from the past 240 hour(s))  Blood culture (routine x 2)     Status: None (Preliminary result)   Collection Time: 12/30/17 12:45 AM  Result Value Ref Range Status   Specimen Description BLOOD RIGHT ARM  Final   Special Requests   Final    BOTTLES DRAWN AEROBIC AND ANAEROBIC Blood Culture results may not be optimal due to an excessive volume of blood received in culture bottles   Culture   Final    NO GROWTH 1 DAY Performed at [facility], [address] [place], [place] [address]    Report Status PENDING  Incomplete  Blood culture (routine x 2)     Status: None (Preliminary result)   Collection Time: 12/30/17  1:00 AM  Result Value Ref Range Status   Specimen Description BLOOD RIGHT HAND  Final   Special Requests   Final    BOTTLES DRAWN AEROBIC ONLY Blood Culture results may not be optimal due to an excessive volume of blood received in culture bottles   Culture   Final    NO GROWTH 1 DAY Performed at [facility], [address] [place], [place] [address]    Report Status PENDING  Incomplete  Surgical PCR screen     Status: Abnormal   Collection Time: 12/30/17  9:42 PM  Result Value Ref Range Status   MRSA, PCR NEGATIVE NEGATIVE Final   Staphylococcus aureus  POSITIVE (A) NEGATIVE Final    Comment: RESULT CALLED TO, READ BACK BY AND VERIFIED WITH: RN NELLIA RAGAAS [id number] @0035  [facility]           Radiology Studies: Dg Elbow Complete Left  Result Date: 12/30/2017 CLINICAL DATA:  Abscess to the left elbow.  IV drug user. EXAM: LEFT ELBOW - COMPLETE 3+ VIEW COMPARISON:  12/20/2017 FINDINGS: There is no evidence of fracture, dislocation, or joint effusion. There is no evidence of arthropathy or other focal bone abnormality. Soft tissues are unremarkable. IMPRESSION: Negative. Electronically Signed   By: Burman Nieves M.D.   On: 12/30/2017 02:16   Mr Elbow Left W Wo Contrast  Result Date: 12/31/2017 CLINICAL DATA:  Left elbow abscess. EXAM: MRI OF THE LEFT ELBOW WITHOUT AND WITH CONTRAST TECHNIQUE: Multiplanar, multisequence MR imaging of the elbow was performed before and  after the administration of intravenous contrast. CONTRAST:  7 cc Gadavist intravenous contrast. COMPARISON:  Left arm ultrasound from same day. FINDINGS: TENDONS Common forearm flexor origin: Intact. Mild peritendinous edema and enhancement. Common forearm extensor origin: Intact with normal signal. Biceps: Intact. Triceps: Intact with normal signal. LIGAMENTS Medial stabilizers: Intact. Lateral stabilizers: The lateral ulnar and radial collateral ligaments appear intact. Cartilage: Preserved.  No focal chondral defect demonstrated. Joint: No joint effusion or loose body observed. Cubital tunnel: Mildly increased T2 signal within the ulnar nerve in the cubital tunnel. Bones: No acute or significant extra-articular osseous findings. Other: Within the antecubital fossa, there is an ill-defined, thick walled, rim enhancing fluid collection measuring approximately 1.2 x 1.9 x 3.4 cm. There is prominent surrounding edema and enhancement which extends into the proximal forearm flexor muscles. IMPRESSION: 1. Prominent phlegmonous change within the antecubital fossa with small ill-defined  abscess measuring approximately 3.4 cm. Adjacent myositis within the proximal forearm flexor musculature. 2. Mildly increased T2 signal within the ulnar nerve in the cubital tunnel. Correlate for ulnar neuropathy. Electronically Signed   By: Obie Dredge M.D.   On: 12/31/2017 09:22   [place] Lt Upper Extrem Ltd Soft Tissue Non Vascular  Result Date: 12/30/2017 CLINICAL DATA:  Left antecubital fossa abscess. History of IV drug abuse. EXAM: ULTRASOUND LEFT UPPER EXTREMITY LIMITED TECHNIQUE: Ultrasound examination of the upper extremity soft tissues was performed in the area of clinical concern. COMPARISON:  Left elbow x-rays from same day. FINDINGS: Focused ultrasound in the left antecubital fossa demonstrates a irregular, heterogeneously complex fluid collection measuring 6.2 x 3.3 x 4.5 cm. There is no internal vascularity. There is mild surrounding hyperemia. IMPRESSION: 1. 6.2 cm complex fluid collection in the antecubital fossa, most consistent with abscess given clinical history. Electronically Signed   By: Obie Dredge M.D.   On: 12/30/2017 10:08        Scheduled Meds: . Chlorhexidine Gluconate Cloth  6 each Topical Daily  . enoxaparin (LOVENOX) injection  40 mg Subcutaneous Daily  . ketorolac      . mupirocin ointment  1 application Nasal BID   Continuous Infusions: . cefTRIAXone (ROCEPHIN)  IV Stopped (12/30/17 1823)  . metronidazole 500 mg (12/31/17 1018)     LOS: 1 day    Time spent: 25 minutes    Alberteen Sam, MD [facility] 12/31/2017, 11:44 AM     Pager [phone] --- please page though AMION:  [email or web address] Password [email or web address] If 7PM-7AM, please contact night-coverage

## 2017-12-31 NOTE — Transfer of Care (Signed)
Immediate Anesthesia Transfer of Care Note  Patient: Elijah Hall  Procedure(s) Performed: IRRIGATION AND DEBRIDEMENT ELBOW ABSCESS (Left Arm Lower)  Patient Location: PACU  Anesthesia Type:General  Level of Consciousness: awake, alert , oriented and patient cooperative  Airway & Oxygen Therapy: Patient Spontanous Breathing  Post-op Assessment: Report given to RN and Post -op Vital signs reviewed and stable  Post vital signs: Reviewed  Last Vitals: 123/63, 66, 12, 100% Vitals Value Taken Time  BP    Temp    Pulse    Resp    SpO2      Last Pain:  Vitals:   12/31/17 0737  TempSrc: Oral  PainSc:          Complications: No apparent anesthesia complications

## 2017-12-31 NOTE — Progress Notes (Signed)
Subjective: Has had some improvement in his symptoms.  Still with pain, swelling and redness of left elbow and antecubital fossa.   Objective: Vital signs in last 24 hours: Temp:  [97.8 F (36.6 C)-98.5 F (36.9 C)] 97.8 F (36.6 C) (10/13 0737) Pulse Rate:  [55-62] 62 (10/13 0737) Resp:  [16-26] 18 (10/13 0737) BP: (111-140)/(55-71) 140/70 (10/13 0737) SpO2:  [100 %] 100 % (10/13 0737)  Intake/Output from previous day: 10/12 0701 - 10/13 0700 In: 1259.1 [P.O.:840; IV Piggyback:419.1] Out: -  Intake/Output this shift: No intake/output data recorded.  Recent Labs    12/30/17 0055 12/31/17 0148  HGB 13.0 12.6*   Recent Labs    12/30/17 0055 12/31/17 0148  WBC 10.3 6.7  RBC 4.85 4.68  HCT 40.6 39.6  PLT 385 411*   Recent Labs    12/30/17 0055 12/31/17 0148  NA 139 142  K 3.4* 3.9  CL 105 108  CO2 23 26  BUN 9 9  CREATININE 0.89 0.87  GLUCOSE 120* 114*  CALCIUM 9.0 8.9   No results for input(s): LABPT, INR in the last 72 hours.  Awake and alert Left arm with dressing overlying draining ulcer in medial antecubital fossa. SILT m,u,r nerves. Motor intact ain, pin, m,u,r nerves.   MRI reveals myositis and small abscess.  Assessment/Plan:  Will proceed with open I&D in OR this am.  Will obtain cultures and place small penrose drain.   Continue abx per medical team.   Terance Hart 12/31/2017, 8:26 AM

## 2018-01-01 ENCOUNTER — Encounter (HOSPITAL_COMMUNITY): Payer: Self-pay | Admitting: Orthopaedic Surgery

## 2018-01-01 ENCOUNTER — Other Ambulatory Visit: Payer: Self-pay

## 2018-01-01 MED ORDER — OXYCODONE HCL 5 MG PO TABS
5.0000 mg | ORAL_TABLET | ORAL | Status: DC | PRN
  Administered 2018-01-01 – 2018-01-02 (×6): 10 mg via ORAL
  Filled 2018-01-01 (×6): qty 2

## 2018-01-01 NOTE — Progress Notes (Signed)
Subjective: 1 Day Post-Op Procedure(s) (LRB): IRRIGATION AND DEBRIDEMENT ELBOW ABSCESS (Left) Pain in the left arm has improved.  He is able to range his elbow better but still has difficulty with extension.  No fevers, no chills.   Objective: Vital signs in last 24 hours: Temp:  [97.7 F (36.5 C)-98.5 F (36.9 C)] 97.9 F (36.6 C) (10/14 0440) Pulse Rate:  [55-69] 55 (10/14 0440) Resp:  [17-20] 20 (10/14 0440) BP: (112-129)/(57-75) 114/63 (10/14 0440) SpO2:  [97 %-100 %] 100 % (10/14 0440)   Recent Labs    12/30/17 0055 12/31/17 0148  HGB 13.0 12.6*   Recent Labs    12/30/17 0055 12/31/17 0148  WBC 10.3 6.7  RBC 4.85 4.68  HCT 40.6 39.6  PLT 385 411*     Patient is awake and alert. Left upper extremity dressing in place.  Erythema has improved.  He is able to range his elbow some but has difficulty with extension.  Is able to move all digits actively.  Endorses silt over digits.  Microbiology: Scant gram-positive cocci on Gram stain.   Assessment/Plan: 1 Day Post-Op Procedure(s) (LRB): IRRIGATION AND DEBRIDEMENT ELBOW ABSCESS (Left)  We will give the patient 1 more night.  I will look at the wound tomorrow.  If it looks appropriate I will remove the drain however he may be discharged with the drain and need close follow-up for wound check and drain removal.  We did discuss that he may need occupational therapy to work on elbow range of motion as he is very stiff.  We will discuss this as an outpatient.  His culture has scant gram-positive cocci, antibiotics per medicine.    Elijah Hall 01/01/2018, 1:08 PM

## 2018-01-01 NOTE — Progress Notes (Signed)
PROGRESS NOTE    Elijah Hall  NFA:[id number] DOB: [birth year] DOA: 12/30/2017 PCP: Patient, No Pcp Per      Brief Narrative:  Mr. Suastegui is a 22 y.o. M with hx IVDU, remote MSSA bacteremia who presented with left AC infection and abscess failed outpatient therapy.  US showed fluid collection, confirmed on MRI.  To OR 10/13 for I&D.   Assessment & Plan:  Cellulitis and abscess of the left antecubital fossa Culture gram stain GPCs.  Moderate sanguinous dicsharge from drain overnight.  Pain improving. -Continue CTX, Flagyl  -Acetaminophen, oxycodone 10, ketorolac for pain control -Consult hand surgery, appreciate expert care    IVDU -COnsult SW   Hypokalemia Resolved     DVT prophylaxis: Lovenox Code Status: FULL Family Communication: None present MDM and disposition Plan: The below labs and imaging reports were reviewed and summarized above.  Medication management as above.  The patient was admitted with cellulitis and abscess related to IV drug use.  His cultures growing some gram-positive cocci, hopefully we will be able to narrow to oral antibiotics and discharge once his drain output is reduced.  I will discuss with orthopedics today timing of drain removal, he has a nontoxic and improving.  This is a limb threatening infection, the patient requires ongoing IV antibiotics.   Consultants:   Orthopecis  Procedures:   I&D 10/13  Antimicrobials:   Vancomycin x1 10/13  Ceftriaxone 10/12 >>  Flagyl 10/12 >>   Culture data:   10/2 I&D culture: MSSA, Enterobacter, Prevotella  10/12 blood culture 2/2: NGTD  10/13 Intraop culture: NGTD GPCs on gram stain      Subjective: Pain is improved from yesterday.  No new fever, confusion, malaise, aches.  No vomiting.  Redness and swelling improved, slightly.  Drainage a little overnight.      Objective: Vitals:   12/31/17 1829 12/31/17 1949 01/01/18 0009 01/01/18 0440  BP: (!) 114/57 112/65 119/66 114/63    Pulse: 63 67 63 (!) 55  Resp: 17 20 20 20   Temp: 98.1 F (36.7 C) 98 F (36.7 C) 98.5 F (36.9 C) 97.9 F (36.6 C)  TempSrc: Oral Oral Oral Oral  SpO2: 97% 100% 99% 100%    Intake/Output Summary (Last 24 hours) at 01/01/2018 0920 Last data filed at 12/31/2017 1400 Gross per 24 hour  Intake 1380 ml  Output 20 ml  Net 1360 ml   There were no vitals filed for this visit.  Examination: General appearance: Thin adult male, lying in bed, then ambulating in the hall, no acute distress HEENT: Anicteric, conjunctiva pink, lids and lashes normal. No nasal deformity, discharge, epistaxis.  Lips moist, OP normal, hearing normal.   Skin: Warm and dry.  No jaundice.  There is some redness and swelling and induration around the left elbow, this is slightly reduced from yesterday. Cardiac: Heart rate regular, rhythm normal, no murmurs appreciated.  JVP normal.  No lower extremity edema. Respiratory: Normal respiratory rate and rhythm, lungs clear without rales or wheezes. Abdomen: Abdomen soft.  no TTP. No ascites, distension, hepatosplenomegaly.   MSK: Drain in place in the left elbow, moderate serosanguinous discharge. Neuro: Awake and alert, extraocular movements intact, moves all extremities, gait normal, speech fluent.    Psych: Sensorium intact and responding to questions, attention normal.  Affect blunted.  Judgment and insight appear normal.     Data Reviewed: I have personally reviewed following labs and imaging studies:  CBC: Recent Labs  Lab 12/25/17 1210 12/30/17 0055 12/31/17 0148  WBC 7.8 10.3 6.7  NEUTROABS 4.9 6.8  --   HGB 14.8 13.0 12.6*  HCT 45.0 40.6 39.6  MCV 85.2 83.7 84.6  PLT 330 385 411*   Basic Metabolic Panel: Recent Labs  Lab 12/25/17 1210 12/30/17 0055 12/31/17 0148  NA 136 139 142  K 4.1 3.4* 3.9  CL 104 105 108  CO2 24 23 26   GLUCOSE 117* 120* 114*  BUN 10 9 9   CREATININE 0.92 0.89 0.87  CALCIUM 9.6 9.0 8.9   GFR: Estimated Creatinine  Clearance: 130.2 mL/min (by C-G formula based on SCr of 0.87 mg/dL). Liver Function Tests: Recent Labs  Lab 12/25/17 1210 12/30/17 0055  AST 17 20  ALT 11 18  ALKPHOS 58 61  BILITOT 0.5 0.6  PROT 8.0 7.9  ALBUMIN 4.1 3.8   No results for input(s): LIPASE, AMYLASE in the last 168 hours. No results for input(s): AMMONIA in the last 168 hours. Coagulation Profile: No results for input(s): INR, PROTIME in the last 168 hours. Cardiac Enzymes: No results for input(s): CKTOTAL, CKMB, CKMBINDEX, TROPONINI in the last 168 hours. BNP (last 3 results) No results for input(s): PROBNP in the last 8760 hours. HbA1C: No results for input(s): HGBA1C in the last 72 hours. CBG: No results for input(s): GLUCAP in the last 168 hours. Lipid Profile: No results for input(s): CHOL, HDL, LDLCALC, TRIG, CHOLHDL, LDLDIRECT in the last 72 hours. Thyroid Function Tests: No results for input(s): TSH, T4TOTAL, FREET4, T3FREE, THYROIDAB in the last 72 hours. Anemia Panel: No results for input(s): VITAMINB12, FOLATE, FERRITIN, TIBC, IRON, RETICCTPCT in the last 72 hours. Urine analysis: No results found for: COLORURINE, APPEARANCEUR, LABSPEC, PHURINE, GLUCOSEU, HGBUR, BILIRUBINUR, KETONESUR, PROTEINUR, UROBILINOGEN, NITRITE, LEUKOCYTESUR Sepsis Labs: @LABRCNTIP (procalcitonin:4,lacticacidven:4)  ) Recent Results (from the past 240 hour(s))  Blood culture (routine x 2)     Status: None (Preliminary result)   Collection Time: 12/30/17 12:45 AM  Result Value Ref Range Status   Specimen Description BLOOD RIGHT ARM  Final   Special Requests   Final    BOTTLES DRAWN AEROBIC AND ANAEROBIC Blood Culture results may not be optimal due to an excessive volume of blood received in culture bottles   Culture   Final    NO GROWTH 1 DAY Performed at [facility], [address], [place], [place] [address]    Report Status PENDING  Incomplete  Blood culture (routine x 2)     Status: None (Preliminary result)    Collection Time: 12/30/17  1:00 AM  Result Value Ref Range Status   Specimen Description BLOOD RIGHT HAND  Final   Special Requests   Final    BOTTLES DRAWN AEROBIC ONLY Blood Culture results may not be optimal due to an excessive volume of blood received in culture bottles   Culture   Final    NO GROWTH 1 DAY Performed at [facility], [address], [place], [place] [address]    Report Status PENDING  Incomplete  Surgical PCR screen     Status: Abnormal   Collection Time: 12/30/17  9:42 PM  Result Value Ref Range Status   MRSA, PCR NEGATIVE NEGATIVE Final   Staphylococcus aureus POSITIVE (A) NEGATIVE Final    Comment: RESULT CALLED TO, READ BACK BY AND VERIFIED WITH: RN NELLIA RAGAAS [id number] @0035  [facility]    Aerobic/Anaerobic Culture (surgical/deep wound)     Status: None (Preliminary result)   Collection Time: 12/31/17  8:52 AM  Result Value Ref Range  Status   Specimen Description ABSCESS LEFT ANTECUBITAL  Final   Special Requests NONE  Final   Gram Stain   Final    MODERATE WBC PRESENT,BOTH PMN AND MONONUCLEAR RARE GRAM POSITIVE COCCI Performed at [facility], [address], [place], [place] [address]    Culture PENDING  Incomplete   Report Status PENDING  Incomplete         Radiology Studies: Mr Elbow Left W Wo Contrast  Result Date: 12/31/2017 CLINICAL DATA:  Left elbow abscess. EXAM: MRI OF THE LEFT ELBOW WITHOUT AND WITH CONTRAST TECHNIQUE: Multiplanar, multisequence MR imaging of the elbow was performed before and after the administration of intravenous contrast. CONTRAST:  7 cc Gadavist intravenous contrast. COMPARISON:  Left arm ultrasound from same day. FINDINGS: TENDONS Common forearm flexor origin: Intact. Mild peritendinous edema and enhancement. Common forearm extensor origin: Intact with normal signal. Biceps: Intact. Triceps: Intact with normal signal. LIGAMENTS Medial stabilizers: Intact. Lateral stabilizers: The lateral ulnar and radial  collateral ligaments appear intact. Cartilage: Preserved.  No focal chondral defect demonstrated. Joint: No joint effusion or loose body observed. Cubital tunnel: Mildly increased T2 signal within the ulnar nerve in the cubital tunnel. Bones: No acute or significant extra-articular osseous findings. Other: Within the antecubital fossa, there is an ill-defined, thick walled, rim enhancing fluid collection measuring approximately 1.2 x 1.9 x 3.4 cm. There is prominent surrounding edema and enhancement which extends into the proximal forearm flexor muscles. IMPRESSION: 1. Prominent phlegmonous change within the antecubital fossa with small ill-defined abscess measuring approximately 3.4 cm. Adjacent myositis within the proximal forearm flexor musculature. 2. Mildly increased T2 signal within the ulnar nerve in the cubital tunnel. Correlate for ulnar neuropathy. Electronically Signed   By: Obie Dredge M.D.   On: 12/31/2017 09:22   [place] Lt Upper Extrem Ltd Soft Tissue Non Vascular  Result Date: 12/30/2017 CLINICAL DATA:  Left antecubital fossa abscess. History of IV drug abuse. EXAM: ULTRASOUND LEFT UPPER EXTREMITY LIMITED TECHNIQUE: Ultrasound examination of the upper extremity soft tissues was performed in the area of clinical concern. COMPARISON:  Left elbow x-rays from same day. FINDINGS: Focused ultrasound in the left antecubital fossa demonstrates a irregular, heterogeneously complex fluid collection measuring 6.2 x 3.3 x 4.5 cm. There is no internal vascularity. There is mild surrounding hyperemia. IMPRESSION: 1. 6.2 cm complex fluid collection in the antecubital fossa, most consistent with abscess given clinical history. Electronically Signed   By: Obie Dredge M.D.   On: 12/30/2017 10:08        Scheduled Meds: . Chlorhexidine Gluconate Cloth  6 each Topical Daily  . enoxaparin (LOVENOX) injection  40 mg Subcutaneous Daily  . mupirocin ointment  1 application Nasal BID   Continuous  Infusions: . cefTRIAXone (ROCEPHIN)  IV 2 g (12/31/17 1753)  . metronidazole 500 mg (01/01/18 0400)     LOS: 2 days    Time spent: 25 minutes    Alberteen Sam, MD [facility] 01/01/2018, 9:20 AM     Pager [phone] --- please page though AMION:  [email or web address] Password [email or web address] If 7PM-7AM, please contact night-coverage

## 2018-01-02 MED ORDER — METRONIDAZOLE 500 MG PO TABS: 500.0000 mg | ORAL_TABLET | Freq: Three times a day (TID) | ORAL | 0 refills | Status: AC

## 2018-01-02 MED ORDER — CEFPODOXIME PROXETIL 200 MG PO TABS: 200.0000 mg | ORAL_TABLET | Freq: Two times a day (BID) | ORAL | 0 refills | Status: AC

## 2018-01-02 MED ORDER — METRONIDAZOLE 500 MG PO TABS: 500.0000 mg | ORAL_TABLET | Freq: Three times a day (TID) | ORAL | Status: DC

## 2018-01-02 MED ORDER — ACETAMINOPHEN 500 MG PO TABS: 1000.0000 mg | ORAL_TABLET | Freq: Three times a day (TID) | ORAL | 0 refills | Status: AC | PRN

## 2018-01-02 NOTE — Discharge Summary (Signed)
Physician Discharge Summary  Adhrit Krenz ZOX:[id number] DOB: January 04, 1996 DOA: 12/30/2017  PCP: Patient, No Pcp Per  Admit date: 12/30/2017 Discharge date: 01/02/2018  Admitted From: Home  Disposition:  Home   Recommendations for Outpatient Follow-up:  1. Follow up with Orthopedics in 1 week     Home Health: None  Equipment/Devices: None  Discharge Condition: Good  CODE STATUS: FULL Diet recommendation: Regular  Brief/Interim Summary: Mr. Self is a 22 y.o. M with hx IVDU, remote MSSA bacteremia who presented with left AC infection and abscess failed outpatient therapy.  US showed fluid collection, confirmed on MRI.  To OR 10/13 for I&D.     PRINCIPAL HOSPITAL DIAGNOSIS: IVDU related cellulitis and abscess of the left arm    Discharge Diagnoses:   Cellulitis and abscess of the left antecubital fossa Admitted and started on ceftriaxone and Flagyl given recent Enterobacter, MSSA and Prevotella culture.  Orthopedics consulted and underwent MRI that showed fluid collection, then subsequently I&D in the OR on 10/13 with drain placement.  Drainage from drain improved, elbow redness and swelling and pain improved and the patient had no further fever, was taking PO well and was eager for discharge home.  Discharged with cefpodoxime and Flagyl for 8 more days and close follow up with Orthopedics.    IVDU  Hypokalemia Resolved          Discharge Instructions  Discharge Instructions    Discharge instructions   Complete by:  As directed    From Dr. Maryfrances Bunnell: You were admitted with an elbow abscess (collection of infection and pus). We treated you with broad spectrum antibiotics, and Dr. Susa Simmonds cleaned the area out in the operating room.  Leave the drain in place. Cover it with a gauze dressing daily. Limit your lifting of heavy objects with your left arm to less than 10 lbs Do not shower or submerge the arm in water until the drain has been removed or Dr. Susa Simmonds  clears it.  For the infection: Take cefpodoxime/Vantin 200 mg twice daily for 8 days Take metronidazole/Flagyl 500 mg three times daily for 8 days For pain, take up to acetaminophen/Tylenol 1000 mg three times per day Try either ibuprofen 600 mg (three tabs) three times a day or naproxen/Aleve 500 mg (2 tabs) twice a day, but avoid taking these two more than 7 days, as they can hurt the stomach.    Call Dr. Donnie Mesa office for a follow up appopintment: [phone]   Increase activity slowly   Complete by:  As directed      Allergies as of 01/02/2018      Reactions   Sulfa Antibiotics Swelling, Anaphylaxis      Medication List    STOP taking these medications   cephALEXin 500 MG capsule Commonly known as:  KEFLEX     TAKE these medications   acetaminophen 500 MG tablet Commonly known as:  TYLENOL Take 2 tablets (1,000 mg total) by mouth every 8 (eight) hours as needed (pain). What changed:    when to take this  reasons to take this   cefpodoxime 200 MG tablet Commonly known as:  VANTIN Take 1 tablet (200 mg total) by mouth 2 (two) times daily.   metroNIDAZOLE 500 MG tablet Commonly known as:  FLAGYL Take 1 tablet (500 mg total) by mouth 3 (three) times daily for 8 days.      Follow-up Information    Terance Hart, MD Follow up.   Specialty:  Orthopedic Surgery Why:  Will  call you to schedule appt on Mon 10/21 Contact information: [address] [place] [address] [phone]          Allergies  Allergen Reactions  . Sulfa Antibiotics Swelling and Anaphylaxis    Consultations:  Orthopedics   Procedures/Studies: Dg Elbow Complete Left  Result Date: 12/30/2017 CLINICAL DATA:  Abscess to the left elbow.  IV drug user. EXAM: LEFT ELBOW - COMPLETE 3+ VIEW COMPARISON:  12/20/2017 FINDINGS: There is no evidence of fracture, dislocation, or joint effusion. There is no evidence of arthropathy or other focal bone abnormality. Soft tissues are  unremarkable. IMPRESSION: Negative. Electronically Signed   By: Burman Nieves M.D.   On: 12/30/2017 02:16   Dg Elbow Complete Left  Result Date: 12/20/2017 CLINICAL DATA:  Swelling of the anterior elbow, IV drug user, possible infection EXAM: LEFT ELBOW - COMPLETE 3+ VIEW COMPARISON:  None. FINDINGS: Alignment is normal. The elbow joint spaces appear normal. No fracture seen and no joint effusion is noted. IMPRESSION: Negative. Electronically Signed   By: Dwyane Dee M.D.   On: 12/20/2017 14:49   Mr Elbow Left W Wo Contrast  Result Date: 12/31/2017 CLINICAL DATA:  Left elbow abscess. EXAM: MRI OF THE LEFT ELBOW WITHOUT AND WITH CONTRAST TECHNIQUE: Multiplanar, multisequence MR imaging of the elbow was performed before and after the administration of intravenous contrast. CONTRAST:  7 cc Gadavist intravenous contrast. COMPARISON:  Left arm ultrasound from same day. FINDINGS: TENDONS Common forearm flexor origin: Intact. Mild peritendinous edema and enhancement. Common forearm extensor origin: Intact with normal signal. Biceps: Intact. Triceps: Intact with normal signal. LIGAMENTS Medial stabilizers: Intact. Lateral stabilizers: The lateral ulnar and radial collateral ligaments appear intact. Cartilage: Preserved.  No focal chondral defect demonstrated. Joint: No joint effusion or loose body observed. Cubital tunnel: Mildly increased T2 signal within the ulnar nerve in the cubital tunnel. Bones: No acute or significant extra-articular osseous findings. Other: Within the antecubital fossa, there is an ill-defined, thick walled, rim enhancing fluid collection measuring approximately 1.2 x 1.9 x 3.4 cm. There is prominent surrounding edema and enhancement which extends into the proximal forearm flexor muscles. IMPRESSION: 1. Prominent phlegmonous change within the antecubital fossa with small ill-defined abscess measuring approximately 3.4 cm. Adjacent myositis within the proximal forearm flexor musculature.  2. Mildly increased T2 signal within the ulnar nerve in the cubital tunnel. Correlate for ulnar neuropathy. Electronically Signed   By: Obie Dredge M.D.   On: 12/31/2017 09:22   [place] Lt Upper Extrem Ltd Soft Tissue Non Vascular  Result Date: 12/30/2017 CLINICAL DATA:  Left antecubital fossa abscess. History of IV drug abuse. EXAM: ULTRASOUND LEFT UPPER EXTREMITY LIMITED TECHNIQUE: Ultrasound examination of the upper extremity soft tissues was performed in the area of clinical concern. COMPARISON:  Left elbow x-rays from same day. FINDINGS: Focused ultrasound in the left antecubital fossa demonstrates a irregular, heterogeneously complex fluid collection measuring 6.2 x 3.3 x 4.5 cm. There is no internal vascularity. There is mild surrounding hyperemia. IMPRESSION: 1. 6.2 cm complex fluid collection in the antecubital fossa, most consistent with abscess given clinical history. Electronically Signed   By: Obie Dredge M.D.   On: 12/30/2017 10:08       Subjective: Feeling well.  No fever, nausea, malaise, vomiting.  No confusion, syncope.  Left elbow is better, less redness and swelling.  Discharge Exam: Vitals:   01/01/18 2051 01/02/18 0521  BP: 114/68 125/74  Pulse: (!) 52 (!) 45  Resp: 16 16  Temp: 97.7  F (36.5 C) (!) 97.4 F (36.3 C)  SpO2: 100% 100%   Vitals:   01/01/18 0440 01/01/18 1702 01/01/18 2051 01/02/18 0521  BP: 114/63 127/75 114/68 125/74  Pulse: (!) 55 (!) 58 (!) 52 (!) 45  Resp: 20 18 16 16   Temp: 97.9 F (36.6 C) 97.7 F (36.5 C) 97.7 F (36.5 C) (!) 97.4 F (36.3 C)  TempSrc: Oral Oral Oral Oral  SpO2: 100% 100% 100% 100%    General: Pt is alert, awake, not in acute distress Cardiovascular: RRR, nl S1-S2, no murmurs appreciated.   No LE edema.   Respiratory: Normal respiratory rate and rhythm.  CTAB without rales or wheezes. Abdominal: Abdomen soft and non-tender.  No distension or HSM.   MSK: The left elbow is still somewhat tender to palpation, but  redness significantly lesss, swelling better, no induration or flucutaance.  Only small red serosanguinous discahrge.  Neuro/Psych: Strength symmetric in upper and lower extremities.  Judgment and insight appear normal.   The results of significant diagnostics from this hospitalization (including imaging, microbiology, ancillary and laboratory) are listed below for reference.     Microbiology: Recent Results (from the past 240 hour(s))  Blood culture (routine x 2)     Status: None (Preliminary result)   Collection Time: 12/30/17 12:45 AM  Result Value Ref Range Status   Specimen Description BLOOD RIGHT ARM  Final   Special Requests   Final    BOTTLES DRAWN AEROBIC AND ANAEROBIC Blood Culture results may not be optimal due to an excessive volume of blood received in culture bottles   Culture   Final    NO GROWTH 3 DAYS Performed at [facility], [address], [place], [place] [address]    Report Status PENDING  Incomplete  Blood culture (routine x 2)     Status: None (Preliminary result)   Collection Time: 12/30/17  1:00 AM  Result Value Ref Range Status   Specimen Description BLOOD RIGHT HAND  Final   Special Requests   Final    BOTTLES DRAWN AEROBIC ONLY Blood Culture results may not be optimal due to an excessive volume of blood received in culture bottles   Culture   Final    NO GROWTH 3 DAYS Performed at [facility], [address] [place], [place] [address]    Report Status PENDING  Incomplete  Surgical PCR screen     Status: Abnormal   Collection Time: 12/30/17  9:42 PM  Result Value Ref Range Status   MRSA, PCR NEGATIVE NEGATIVE Final   Staphylococcus aureus POSITIVE (A) NEGATIVE Final    Comment: RESULT CALLED TO, READ BACK BY AND VERIFIED WITH: RN NELLIA RAGAAS [id number] @0035  THANEY    Aerobic/Anaerobic Culture (surgical/deep wound)     Status: None (Preliminary result)   Collection Time: 12/31/17  8:52 AM  Result Value Ref Range Status   Specimen  Description ABSCESS LEFT ANTECUBITAL  Final   Special Requests NONE  Final   Gram Stain   Final    MODERATE WBC PRESENT,BOTH PMN AND MONONUCLEAR RARE GRAM POSITIVE COCCI    Culture   Final    CULTURE REINCUBATED FOR BETTER GROWTH Performed at [facility], [address] [place], [place] [address]    Report Status PENDING  Incomplete     Labs: BNP (last 3 results) No results for input(s): BNP in the last 8760 hours. Basic Metabolic Panel: Recent Labs  Lab 12/30/17 0055 12/31/17 0148  NA 139 142  K 3.4* 3.9  CL 105 108  CO2 23 26  GLUCOSE 120* 114*  BUN 9 9  CREATININE 0.89 0.87  CALCIUM 9.0 8.9   Liver Function Tests: Recent Labs  Lab 12/30/17 0055  AST 20  ALT 18  ALKPHOS 61  BILITOT 0.6  PROT 7.9  ALBUMIN 3.8   No results for input(s): LIPASE, AMYLASE in the last 168 hours. No results for input(s): AMMONIA in the last 168 hours. CBC: Recent Labs  Lab 12/30/17 0055 12/31/17 0148  WBC 10.3 6.7  NEUTROABS 6.8  --   HGB 13.0 12.6*  HCT 40.6 39.6  MCV 83.7 84.6  PLT 385 411*   Cardiac Enzymes: No results for input(s): CKTOTAL, CKMB, CKMBINDEX, TROPONINI in the last 168 hours. BNP: Invalid input(s): POCBNP CBG: No results for input(s): GLUCAP in the last 168 hours. D-Dimer No results for input(s): DDIMER in the last 72 hours. Hgb A1c No results for input(s): HGBA1C in the last 72 hours. Lipid Profile No results for input(s): CHOL, HDL, LDLCALC, TRIG, CHOLHDL, LDLDIRECT in the last 72 hours. Thyroid function studies No results for input(s): TSH, T4TOTAL, T3FREE, THYROIDAB in the last 72 hours.  Invalid input(s): FREET3 Anemia work up No results for input(s): VITAMINB12, FOLATE, FERRITIN, TIBC, IRON, RETICCTPCT in the last 72 hours. Urinalysis No results found for: COLORURINE, APPEARANCEUR, LABSPEC, PHURINE, GLUCOSEU, HGBUR, BILIRUBINUR, KETONESUR, PROTEINUR, UROBILINOGEN, NITRITE, LEUKOCYTESUR Sepsis Labs Invalid input(s): PROCALCITONIN,   WBC,  LACTICIDVEN Microbiology Recent Results (from the past 240 hour(s))  Blood culture (routine x 2)     Status: None (Preliminary result)   Collection Time: 12/30/17 12:45 AM  Result Value Ref Range Status   Specimen Description BLOOD RIGHT ARM  Final   Special Requests   Final    BOTTLES DRAWN AEROBIC AND ANAEROBIC Blood Culture results may not be optimal due to an excessive volume of blood received in culture bottles   Culture   Final    NO GROWTH 3 DAYS Performed at [facility], [address] [place], [place] [address]    Report Status PENDING  Incomplete  Blood culture (routine x 2)     Status: None (Preliminary result)   Collection Time: 12/30/17  1:00 AM  Result Value Ref Range Status   Specimen Description BLOOD RIGHT HAND  Final   Special Requests   Final    BOTTLES DRAWN AEROBIC ONLY Blood Culture results may not be optimal due to an excessive volume of blood received in culture bottles   Culture   Final    NO GROWTH 3 DAYS Performed at [facility], [address] [place], [place] [address]    Report Status PENDING  Incomplete  Surgical PCR screen     Status: Abnormal   Collection Time: 12/30/17  9:42 PM  Result Value Ref Range Status   MRSA, PCR NEGATIVE NEGATIVE Final   Staphylococcus aureus POSITIVE (A) NEGATIVE Final    Comment: RESULT CALLED TO, READ BACK BY AND VERIFIED WITH: RN NELLIA RAGAAS [id number] @0035  THANEY    Aerobic/Anaerobic Culture (surgical/deep wound)     Status: None (Preliminary result)   Collection Time: 12/31/17  8:52 AM  Result Value Ref Range Status   Specimen Description ABSCESS LEFT ANTECUBITAL  Final   Special Requests NONE  Final   Gram Stain   Final    MODERATE WBC PRESENT,BOTH PMN AND MONONUCLEAR RARE GRAM POSITIVE COCCI    Culture   Final  CULTURE REINCUBATED FOR BETTER GROWTH Performed at [facility], [address], [place], [place] [address]    Report Status PENDING  Incomplete     Time  coordinating discharge: 25 minutes       SIGNED:   Alberteen Sam, MD  [facility] 01/02/2018, 4:52 PM

## 2018-01-02 NOTE — Progress Notes (Signed)
Subjective: 2 Days Post-Op Procedure(s) (LRB): IRRIGATION AND DEBRIDEMENT ELBOW ABSCESS (Left)  Patient reports continued pain in left elbow.  Has improves somewhat.  Denies numbness or tingling. Wants to go home.   Objective: Vital signs in last 24 hours: Temp:  [97.4 F (36.3 C)-97.7 F (36.5 C)] 97.4 F (36.3 C) (10/15 0521) Pulse Rate:  [45-58] 45 (10/15 0521) Resp:  [16-18] 16 (10/15 0521) BP: (114-127)/(68-75) 125/74 (10/15 0521) SpO2:  [100 %] 100 % (10/15 0521)   Recent Labs    12/31/17 0148  HGB 12.6*   Recent Labs    12/31/17 0148  WBC 6.7  RBC 4.68  HCT 39.6  PLT 411*    Awake and alert LUE in dressing with drainage.  Dressing removed.  Penrose in place.  No erythema. Some thin drainage.  SILT m,u,r nerves.  Motor m,u,r nerves.    Assessment/Plan: 2 Days Post-Op Procedure(s) (LRB): IRRIGATION AND DEBRIDEMENT ELBOW ABSCESS (Left)  Okay for discharge from Ortho standpoint.  Maintain penrose.  Will follow up on Monday 10/21 for wound check and drain removal. Abx per medicine.   Terance Hart 01/02/2018, 9:45 AM

## 2018-01-02 NOTE — Progress Notes (Signed)
Pt discharged home with wife in stable condition. AVS given before leaving

## 2018-01-04 LAB — CULTURE, BLOOD (ROUTINE X 2)
CULTURE: NO GROWTH
Culture: NO GROWTH

## 2018-01-05 LAB — AEROBIC/ANAEROBIC CULTURE W GRAM STAIN (SURGICAL/DEEP WOUND)

## 2018-01-05 LAB — AEROBIC/ANAEROBIC CULTURE (SURGICAL/DEEP WOUND)

## 2018-01-09 ENCOUNTER — Telehealth: Payer: Self-pay | Admitting: Emergency Medicine

## 2018-01-09 NOTE — Telephone Encounter (Signed)
Letter returned no forwarding address lost to followup 

## 2018-01-22 ENCOUNTER — Telehealth: Payer: Self-pay | Admitting: Emergency Medicine

## 2018-01-22 NOTE — Telephone Encounter (Signed)
Lost to followup 

## 2020-05-29 IMAGING — CR DG ELBOW COMPLETE 3+V*L*
4 series · 4 of 4 positions shown · non-contrast
Comparison: 12/20/2017

CLINICAL DATA: Abscess to the left elbow.  IV drug user.

EXAM:
LEFT ELBOW - COMPLETE 3+ VIEW

[elbow ap]
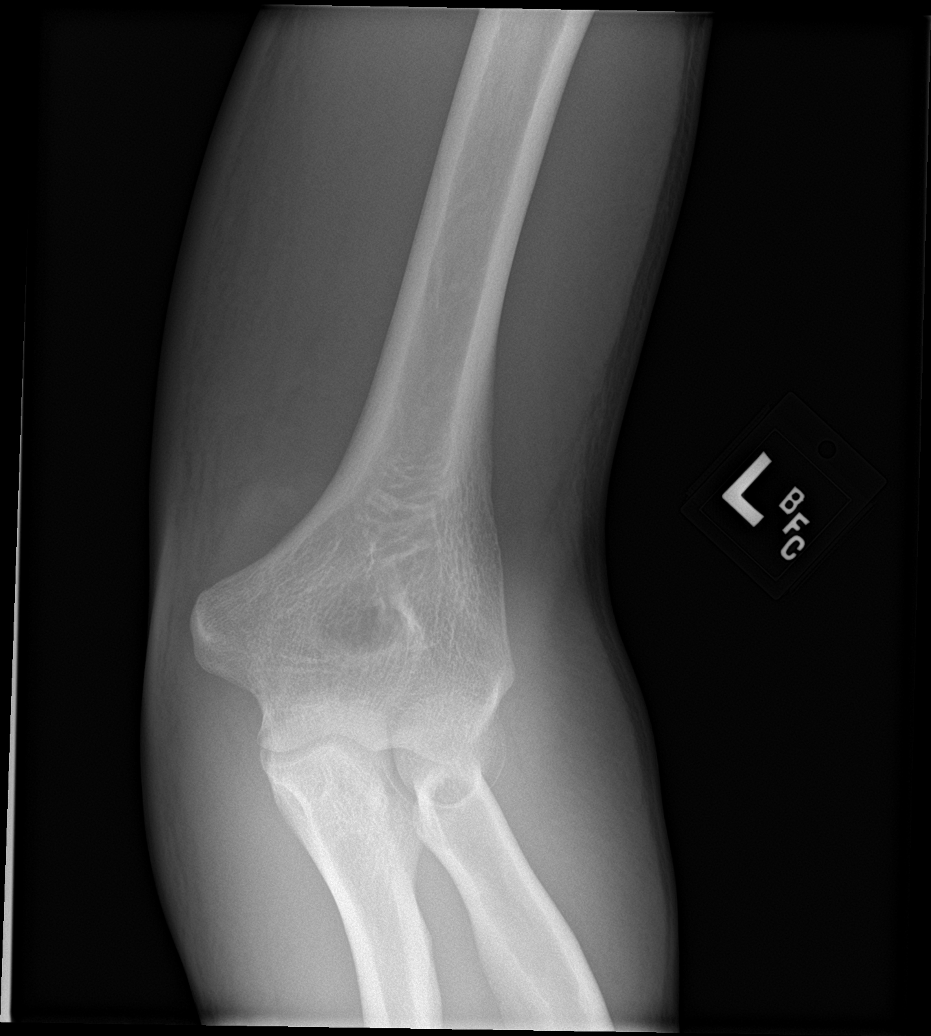

[elbow obl (1 of 2)]
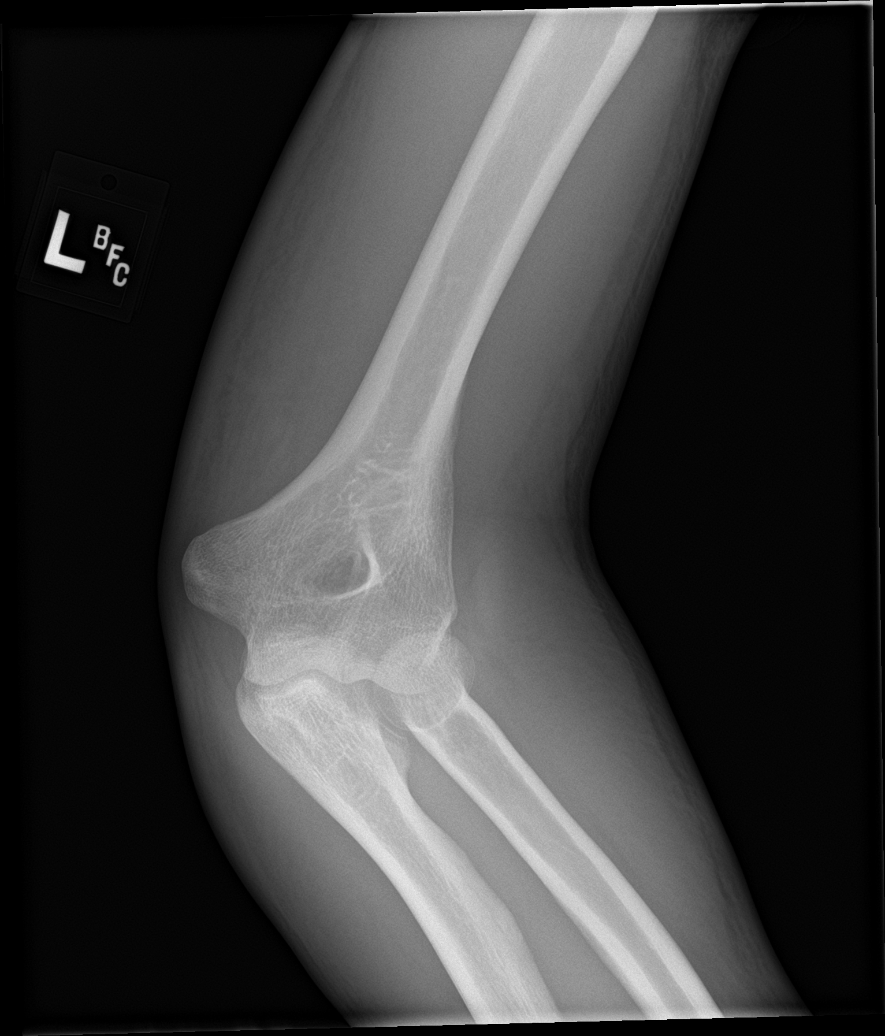

[elbow obl (2 of 2)]
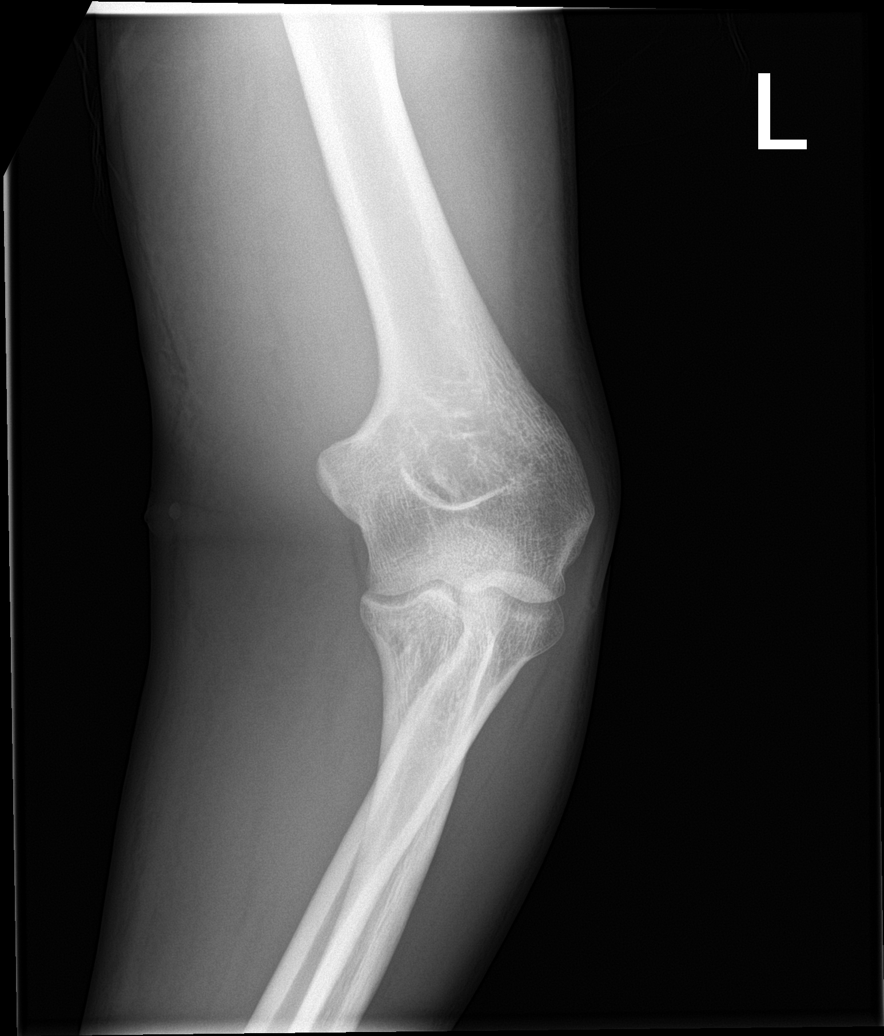

[elbow lat]
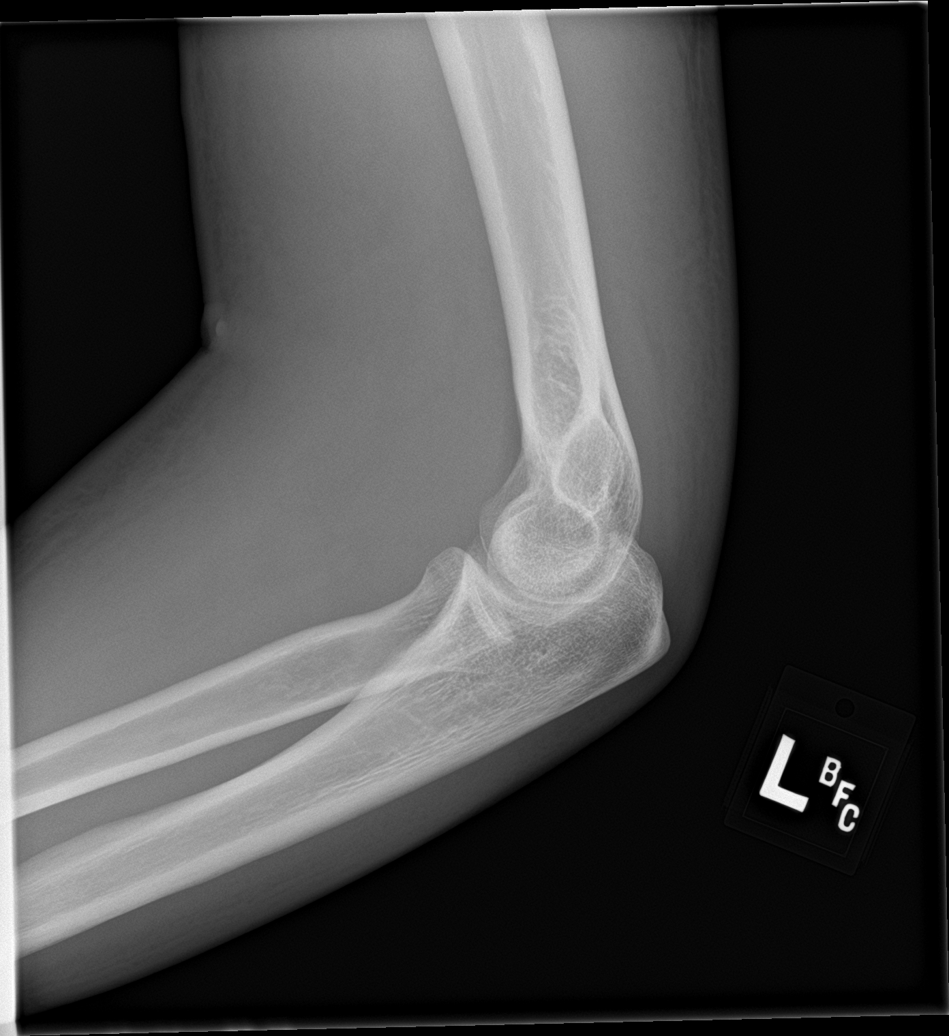

[4 of 4 positions shown; findings below may reference images not displayed]

FINDINGS: There is no evidence of fracture, dislocation, or joint effusion.
There is no evidence of arthropathy or other focal bone abnormality.
Soft tissues are unremarkable.
IMPRESSION: Negative.
